# Patient Record
Sex: Female | Born: 1960
Health system: Southern US, Community
[De-identification: ages and names within clinical notes are randomized; demographics above are authoritative.]

## PROBLEM LIST (undated history)

## (undated) DIAGNOSIS — R7303 Prediabetes: Secondary | ICD-10-CM

## (undated) DIAGNOSIS — E663 Overweight: Secondary | ICD-10-CM

## (undated) DIAGNOSIS — F32A Depression, unspecified: Secondary | ICD-10-CM

## (undated) HISTORY — DX: Prediabetes: R73.03

## (undated) HISTORY — DX: Depression, unspecified: F32.A

## (undated) HISTORY — DX: Overweight: E66.3

## (undated) HISTORY — PX: APPENDECTOMY: SHX54

---

## 2012-07-31 ENCOUNTER — Ambulatory Visit (INDEPENDENT_AMBULATORY_CARE_PROVIDER_SITE_OTHER): Payer: BC Managed Care – PPO | Admitting: Psychology

## 2012-07-31 DIAGNOSIS — F4323 Adjustment disorder with mixed anxiety and depressed mood: Secondary | ICD-10-CM

## 2012-08-30 ENCOUNTER — Ambulatory Visit (INDEPENDENT_AMBULATORY_CARE_PROVIDER_SITE_OTHER): Payer: BC Managed Care – PPO | Admitting: Psychology

## 2012-08-30 DIAGNOSIS — F4323 Adjustment disorder with mixed anxiety and depressed mood: Secondary | ICD-10-CM

## 2012-09-11 ENCOUNTER — Ambulatory Visit (INDEPENDENT_AMBULATORY_CARE_PROVIDER_SITE_OTHER): Payer: BC Managed Care – PPO | Admitting: Psychology

## 2012-09-11 DIAGNOSIS — F4323 Adjustment disorder with mixed anxiety and depressed mood: Secondary | ICD-10-CM

## 2012-12-11 ENCOUNTER — Ambulatory Visit (INDEPENDENT_AMBULATORY_CARE_PROVIDER_SITE_OTHER): Payer: BC Managed Care – PPO | Admitting: Psychology

## 2012-12-11 DIAGNOSIS — F4323 Adjustment disorder with mixed anxiety and depressed mood: Secondary | ICD-10-CM

## 2012-12-18 ENCOUNTER — Ambulatory Visit (INDEPENDENT_AMBULATORY_CARE_PROVIDER_SITE_OTHER): Payer: BC Managed Care – PPO | Admitting: Psychology

## 2012-12-18 DIAGNOSIS — F4323 Adjustment disorder with mixed anxiety and depressed mood: Secondary | ICD-10-CM

## 2013-01-08 ENCOUNTER — Ambulatory Visit: Payer: BC Managed Care – PPO | Admitting: Psychology

## 2013-01-15 ENCOUNTER — Ambulatory Visit (INDEPENDENT_AMBULATORY_CARE_PROVIDER_SITE_OTHER): Payer: BC Managed Care – PPO | Admitting: Psychology

## 2013-01-15 DIAGNOSIS — F4323 Adjustment disorder with mixed anxiety and depressed mood: Secondary | ICD-10-CM

## 2013-01-29 ENCOUNTER — Ambulatory Visit (INDEPENDENT_AMBULATORY_CARE_PROVIDER_SITE_OTHER): Payer: BC Managed Care – PPO | Admitting: Psychology

## 2013-01-29 DIAGNOSIS — F4323 Adjustment disorder with mixed anxiety and depressed mood: Secondary | ICD-10-CM

## 2013-02-05 ENCOUNTER — Ambulatory Visit (INDEPENDENT_AMBULATORY_CARE_PROVIDER_SITE_OTHER): Payer: BC Managed Care – PPO | Admitting: Psychology

## 2013-02-05 DIAGNOSIS — F4323 Adjustment disorder with mixed anxiety and depressed mood: Secondary | ICD-10-CM

## 2013-02-19 ENCOUNTER — Ambulatory Visit (INDEPENDENT_AMBULATORY_CARE_PROVIDER_SITE_OTHER): Payer: BC Managed Care – PPO | Admitting: Psychology

## 2013-02-19 DIAGNOSIS — F4323 Adjustment disorder with mixed anxiety and depressed mood: Secondary | ICD-10-CM

## 2013-03-05 ENCOUNTER — Ambulatory Visit: Payer: BC Managed Care – PPO | Admitting: Psychology

## 2013-03-26 ENCOUNTER — Ambulatory Visit: Payer: BC Managed Care – PPO | Admitting: Psychology

## 2013-04-02 ENCOUNTER — Ambulatory Visit: Payer: BC Managed Care – PPO | Admitting: Psychology

## 2013-04-09 ENCOUNTER — Ambulatory Visit: Payer: BC Managed Care – PPO | Admitting: Psychology

## 2013-10-25 ENCOUNTER — Emergency Department (HOSPITAL_COMMUNITY)
Admission: EM | Admit: 2013-10-25 | Discharge: 2013-10-25 | Disposition: A | Discharge status: Home / Self Care | Payer: BC Managed Care – PPO | Attending: Emergency Medicine | Admitting: Emergency Medicine

## 2013-10-25 ENCOUNTER — Encounter (HOSPITAL_COMMUNITY): Payer: Self-pay | Admitting: Emergency Medicine

## 2013-10-25 DIAGNOSIS — Y9289 Other specified places as the place of occurrence of the external cause: Secondary | ICD-10-CM | POA: Diagnosis not present

## 2013-10-25 DIAGNOSIS — W208XXA Other cause of strike by thrown, projected or falling object, initial encounter: Secondary | ICD-10-CM | POA: Diagnosis not present

## 2013-10-25 DIAGNOSIS — S91119A Laceration without foreign body of unspecified toe without damage to nail, initial encounter: Secondary | ICD-10-CM

## 2013-10-25 DIAGNOSIS — Y9389 Activity, other specified: Secondary | ICD-10-CM | POA: Diagnosis not present

## 2013-10-25 DIAGNOSIS — Z23 Encounter for immunization: Secondary | ICD-10-CM | POA: Diagnosis not present

## 2013-10-25 DIAGNOSIS — S91109A Unspecified open wound of unspecified toe(s) without damage to nail, initial encounter: Secondary | ICD-10-CM | POA: Diagnosis not present

## 2013-10-25 MED ORDER — HYDROCODONE-ACETAMINOPHEN 5-325 MG PO TABS: 1.0000 | ORAL_TABLET | ORAL | Status: DC | PRN

## 2013-10-25 MED ORDER — LIDOCAINE HCL 2 % IJ SOLN
5.0000 mL | Freq: Once | INTRAMUSCULAR | Status: AC
  Administered 2013-10-25: 2 mL via INTRADERMAL
  Filled 2013-10-25: qty 20

## 2013-10-25 MED ORDER — TETANUS-DIPHTH-ACELL PERTUSSIS 5-2.5-18.5 LF-MCG/0.5 IM SUSP
0.5000 mL | Freq: Once | INTRAMUSCULAR | Status: AC
  Administered 2013-10-25: 0.5 mL via INTRAMUSCULAR
  Filled 2013-10-25: qty 0.5

## 2013-10-25 NOTE — ED Notes (Addendum)
MD at bedside. EDP WENTZ TO SUTURE

## 2013-10-25 NOTE — ED Notes (Signed)
Bed: WA08 Expected date:  Expected time:  Means of arrival:  Comments: EMS/53 yo female-lac great toe
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
## 2013-10-25 NOTE — Discharge Instructions (Signed)
Have the sutures removed in 8-10 days. Return here if needed for problems.   Laceration Care, Adult A laceration is a cut or lesion that goes through all layers of the skin and into the tissue just beneath the skin. TREATMENT  Some lacerations may not require closure. Some lacerations may not be able to be closed due to an increased risk of infection. It is important to see your caregiver as soon as possible after an injury to minimize the risk of infection and maximize the opportunity for successful closure. If closure is appropriate, pain medicines may be given, if needed. The wound will be cleaned to help prevent infection. Your caregiver will use stitches (sutures), staples, wound glue (adhesive), or skin adhesive strips to repair the laceration. These tools bring the skin edges together to allow for faster healing and a better cosmetic outcome. However, all wounds will heal with a scar. Once the wound has healed, scarring can be minimized by covering the wound with sunscreen during the day for 1 full year. HOME CARE INSTRUCTIONS  For sutures or staples:  Keep the wound clean and dry.  If you were given a bandage (dressing), you should change it at least once a day. Also, change the dressing if it becomes wet or dirty, or as directed by your caregiver.  Wash the wound with soap and water 2 times a day. Rinse the wound off with water to remove all soap. Pat the wound dry with a clean towel.  After cleaning, apply a thin layer of the antibiotic ointment as recommended by your caregiver. This will help prevent infection and keep the dressing from sticking.  You may shower as usual after the first 24 hours. Do not soak the wound in water until the sutures are removed.  Only take over-the-counter or prescription medicines for pain, discomfort, or fever as directed by your caregiver.  Get your sutures or staples removed as directed by your caregiver. For skin adhesive strips:  Keep the wound  clean and dry.  Do not get the skin adhesive strips wet. You may bathe carefully, using caution to keep the wound dry.  If the wound gets wet, pat it dry with a clean towel.  Skin adhesive strips will fall off on their own. You may trim the strips as the wound heals. Do not remove skin adhesive strips that are still stuck to the wound. They will fall off in time. For wound adhesive:  You may briefly wet your wound in the shower or bath. Do not soak or scrub the wound. Do not swim. Avoid periods of heavy perspiration until the skin adhesive has fallen off on its own. After showering or bathing, gently pat the wound dry with a clean towel.  Do not apply liquid medicine, cream medicine, or ointment medicine to your wound while the skin adhesive is in place. This may loosen the film before your wound is healed.  If a dressing is placed over the wound, be careful not to apply tape directly over the skin adhesive. This may cause the adhesive to be pulled off before the wound is healed.  Avoid prolonged exposure to sunlight or tanning lamps while the skin adhesive is in place. Exposure to ultraviolet light in the first year will darken the scar.  The skin adhesive will usually remain in place for 5 to 10 days, then naturally fall off the skin. Do not pick at the adhesive film. You may need a tetanus shot if:  You cannot  remember when you had your last tetanus shot.  You have never had a tetanus shot. If you get a tetanus shot, your arm may swell, get red, and feel warm to the touch. This is common and not a problem. If you need a tetanus shot and you choose not to have one, there is a rare chance of getting tetanus. Sickness from tetanus can be serious. SEEK MEDICAL CARE IF:   You have redness, swelling, or increasing pain in the wound.  You see a red line that goes away from the wound.  You have yellowish-white fluid (pus) coming from the wound.  You have a fever.  You notice a bad smell  coming from the wound or dressing.  Your wound breaks open before or after sutures have been removed.  You notice something coming out of the wound such as wood or glass.  Your wound is on your hand or foot and you cannot move a finger or toe. SEEK IMMEDIATE MEDICAL CARE IF:   Your pain is not controlled with prescribed medicine.  You have severe swelling around the wound causing pain and numbness or a change in color in your arm, hand, leg, or foot.  Your wound splits open and starts bleeding.  You have worsening numbness, weakness, or loss of function of any joint around or beyond the wound.  You develop painful lumps near the wound or on the skin anywhere on your body. MAKE SURE YOU:   Understand these instructions.  Will watch your condition.  Will get help right away if you are not doing well or get worse. Document Released: 03/05/2005 Document Revised: 05/28/2011 Document Reviewed: 08/29/2010 [facility] Patient Information 2015 [place], [place]. This information is not intended to replace advice given to you by your health care provider. Make sure you discuss any questions you have with your health care provider.  Stitches, Staples, or Skin Adhesive Strips  Stitches (sutures), staples, and skin adhesive strips hold the skin together as it heals. They will usually be in place for 7 days or less. HOME CARE  Wash your hands with soap and water before and after you touch your wound.  Only take medicine as told by your doctor.  Cover your wound only if your doctor told you to. Otherwise, leave it open to air.  Do not get your stitches wet or dirty. If they get dirty, dab them gently with a clean washcloth. Wet the washcloth with soapy water. Do not rub. Pat them dry gently.  Do not put medicine or medicated cream on your stitches unless your doctor told you to.  Do not take out your own stitches or staples. Skin adhesive strips will fall off by themselves.  Do not pick at  the wound. Picking can cause an infection.  Do not miss your follow-up appointment.  If you have problems or questions, call your doctor. GET HELP RIGHT AWAY IF:   You have a temperature by mouth above 102 F (38.9 C), not controlled by medicine.  You have chills.  You have redness or pain around your stitches.  There is puffiness (swelling) around your stitches.  You notice fluid (drainage) from your stitches.  There is a bad smell coming from your wound. MAKE SURE YOU:  Understand these instructions.  Will watch your condition.  Will get help if you are not doing well or get worse. Document Released: 12/31/2008 Document Revised: 05/28/2011 Document Reviewed: 12/31/2008 [facility] Patient Information 2015 [place], [place]. This information is not intended  to replace advice given to you by your health care provider. Make sure you discuss any questions you have with your health care provider. ° °

## 2015-01-17 ENCOUNTER — Other Ambulatory Visit: Payer: Self-pay

## 2015-01-17 DIAGNOSIS — Z1231 Encounter for screening mammogram for malignant neoplasm of breast: Secondary | ICD-10-CM

## 2015-02-17 ENCOUNTER — Ambulatory Visit
Admission: RE | Admit: 2015-02-17 | Discharge: 2015-02-17 | Disposition: A | Discharge status: Home / Self Care | Payer: 59 | Source: Ambulatory Visit

## 2015-02-17 DIAGNOSIS — Z1231 Encounter for screening mammogram for malignant neoplasm of breast: Secondary | ICD-10-CM

## 2015-03-24 ENCOUNTER — Ambulatory Visit: Payer: Self-pay | Admitting: Psychology

## 2015-04-04 ENCOUNTER — Ambulatory Visit (INDEPENDENT_AMBULATORY_CARE_PROVIDER_SITE_OTHER): Payer: 59 | Admitting: Psychology

## 2015-04-04 DIAGNOSIS — F4323 Adjustment disorder with mixed anxiety and depressed mood: Secondary | ICD-10-CM

## 2019-12-29 ENCOUNTER — Ambulatory Visit (INDEPENDENT_AMBULATORY_CARE_PROVIDER_SITE_OTHER): Payer: 59 | Admitting: Psychology

## 2019-12-29 DIAGNOSIS — F4323 Adjustment disorder with mixed anxiety and depressed mood: Secondary | ICD-10-CM | POA: Diagnosis not present

## 2020-01-12 ENCOUNTER — Ambulatory Visit (INDEPENDENT_AMBULATORY_CARE_PROVIDER_SITE_OTHER): Payer: 59 | Admitting: Psychology

## 2020-01-12 DIAGNOSIS — F4323 Adjustment disorder with mixed anxiety and depressed mood: Secondary | ICD-10-CM

## 2020-01-18 ENCOUNTER — Encounter (INDEPENDENT_AMBULATORY_CARE_PROVIDER_SITE_OTHER): Payer: Self-pay | Admitting: Family Medicine

## 2020-01-18 ENCOUNTER — Other Ambulatory Visit: Payer: Self-pay

## 2020-01-18 ENCOUNTER — Ambulatory Visit (INDEPENDENT_AMBULATORY_CARE_PROVIDER_SITE_OTHER): Payer: 59 | Admitting: Family Medicine

## 2020-01-18 VITALS — BP 145/79 | HR 71 | Temp 97.9°F | Ht 67.0 in | Wt 260.0 lb

## 2020-01-18 DIAGNOSIS — R5383 Other fatigue: Secondary | ICD-10-CM

## 2020-01-18 DIAGNOSIS — F32A Depression, unspecified: Secondary | ICD-10-CM | POA: Diagnosis not present

## 2020-01-18 DIAGNOSIS — E7849 Other hyperlipidemia: Secondary | ICD-10-CM

## 2020-01-18 DIAGNOSIS — F419 Anxiety disorder, unspecified: Secondary | ICD-10-CM

## 2020-01-18 DIAGNOSIS — Z0289 Encounter for other administrative examinations: Secondary | ICD-10-CM

## 2020-01-18 DIAGNOSIS — Z9189 Other specified personal risk factors, not elsewhere classified: Secondary | ICD-10-CM | POA: Diagnosis not present

## 2020-01-18 DIAGNOSIS — R7303 Prediabetes: Secondary | ICD-10-CM

## 2020-01-18 DIAGNOSIS — Z6841 Body Mass Index (BMI) 40.0 and over, adult: Secondary | ICD-10-CM

## 2020-01-18 DIAGNOSIS — R030 Elevated blood-pressure reading, without diagnosis of hypertension: Secondary | ICD-10-CM

## 2020-01-18 DIAGNOSIS — R0602 Shortness of breath: Secondary | ICD-10-CM | POA: Diagnosis not present

## 2020-01-18 MED ORDER — ESCITALOPRAM OXALATE 10 MG PO TABS: 10.0000 mg | ORAL_TABLET | Freq: Every day | ORAL | 0 refills | Status: DC

## 2020-01-19 LAB — CBC WITH DIFFERENTIAL/PLATELET
Basophils Absolute: 0 10*3/uL (ref 0.0–0.2)
Basos: 1 %
EOS (ABSOLUTE): 0.2 10*3/uL (ref 0.0–0.4)
Eos: 3 %
Hematocrit: 44.9 % (ref 34.0–46.6)
Hemoglobin: 14.9 g/dL (ref 11.1–15.9)
Immature Grans (Abs): 0 10*3/uL (ref 0.0–0.1)
Immature Granulocytes: 0 %
Lymphocytes Absolute: 2 10*3/uL (ref 0.7–3.1)
Lymphs: 34 %
MCH: 28.9 pg (ref 26.6–33.0)
MCHC: 33.2 g/dL (ref 31.5–35.7)
MCV: 87 fL (ref 79–97)
Monocytes Absolute: 0.5 10*3/uL (ref 0.1–0.9)
Monocytes: 9 %
Neutrophils Absolute: 3.1 10*3/uL (ref 1.4–7.0)
Neutrophils: 53 %
Platelets: 299 10*3/uL (ref 150–450)
RBC: 5.15 x10E6/uL (ref 3.77–5.28)
RDW: 12.8 % (ref 11.7–15.4)
WBC: 5.8 10*3/uL (ref 3.4–10.8)

## 2020-01-19 LAB — COMPREHENSIVE METABOLIC PANEL
ALT: 22 IU/L (ref 0–32)
AST: 15 IU/L (ref 0–40)
Albumin/Globulin Ratio: 1.4 (ref 1.2–2.2)
Albumin: 4.4 g/dL (ref 3.8–4.9)
Alkaline Phosphatase: 159 IU/L — ABNORMAL HIGH (ref 44–121)
BUN/Creatinine Ratio: 11 (ref 9–23)
BUN: 10 mg/dL (ref 6–24)
Bilirubin Total: 0.2 mg/dL (ref 0.0–1.2)
CO2: 25 mmol/L (ref 20–29)
Calcium: 9.7 mg/dL (ref 8.7–10.2)
Chloride: 101 mmol/L (ref 96–106)
Creatinine, Ser: 0.91 mg/dL (ref 0.57–1.00)
GFR calc Af Amer: 81 mL/min/{1.73_m2} (ref >59)
GFR calc non Af Amer: 70 mL/min/{1.73_m2} (ref >59)
Globulin, Total: 3.2 g/dL (ref 1.5–4.5)
Glucose: 112 mg/dL — ABNORMAL HIGH (ref 65–99)
Potassium: 5 mmol/L (ref 3.5–5.2)
Sodium: 138 mmol/L (ref 134–144)
Total Protein: 7.6 g/dL (ref 6.0–8.5)

## 2020-01-19 LAB — HEMOGLOBIN A1C
Est. average glucose Bld gHb Est-mCnc: 128 mg/dL
Hgb A1c MFr Bld: 6.1 % — ABNORMAL HIGH (ref 4.8–5.6)

## 2020-01-19 LAB — LIPID PANEL WITH LDL/HDL RATIO
Cholesterol, Total: 268 mg/dL — ABNORMAL HIGH (ref 100–199)
HDL: 72 mg/dL (ref >39)
LDL Chol Calc (NIH): 177 mg/dL — ABNORMAL HIGH (ref 0–99)
LDL/HDL Ratio: 2.5 ratio (ref 0.0–3.2)
Triglycerides: 112 mg/dL (ref 0–149)
VLDL Cholesterol Cal: 19 mg/dL (ref 5–40)

## 2020-01-19 LAB — VITAMIN D 25 HYDROXY (VIT D DEFICIENCY, FRACTURES): Vit D, 25-Hydroxy: 25.8 ng/mL — ABNORMAL LOW (ref 30.0–100.0)

## 2020-01-19 LAB — T3: T3, Total: 134 ng/dL (ref 71–180)

## 2020-01-19 LAB — FOLATE: Folate: 10.4 ng/mL (ref >3.0)

## 2020-01-19 LAB — VITAMIN B12: Vitamin B-12: 432 pg/mL (ref 232–1245)

## 2020-01-19 LAB — T4: T4, Total: 7.5 ug/dL (ref 4.5–12.0)

## 2020-01-19 LAB — TSH: TSH: 1.68 u[IU]/mL (ref 0.450–4.500)

## 2020-01-19 LAB — INSULIN, RANDOM: INSULIN: 22.1 u[IU]/mL (ref 2.6–24.9)

## 2020-01-21 NOTE — Progress Notes (Signed)
Chief Complaint:   OBESITY Denise Grimes (MR# [id number]) is a 59 y.o. female who presents for evaluation and treatment of obesity and related comorbidities. Current BMI is Body mass index is 40.72 kg/m. Denise Grimes has been struggling with her weight for many years and has been unsuccessful in either losing weight, maintaining weight loss, or reaching her healthy weight goal.  Denise Grimes is currently in the action stage of change and ready to dedicate time achieving and maintaining a healthier weight. Denise Grimes is interested in becoming our patient and working on intensive lifestyle modifications including (but not limited to) diet and exercise for weight loss.  Denise Grimes's habits were reviewed today and are as follows: her desired weight loss is 75lbs, she started gaining weight at age [age], her heaviest weight ever was 260 pounds, she has significant food cravings issues, she is frequently drinking liquids with calories, she frequently makes poor food choices, she frequently eats larger portions than normal and she struggles with emotional eating.  Depression Screen Denise Grimes's Food and Mood (modified PHQ-9) score was 7.  Depression screen PHQ 2/9 01/18/2020  Decreased Interest 1  Down, Depressed, Hopeless 1  PHQ - 2 Score 2  Altered sleeping 0  Tired, decreased energy 2  Change in appetite 3  Feeling bad or failure about yourself  0  Trouble concentrating 0  Moving slowly or fidgety/restless 0  Suicidal thoughts 0  PHQ-9 Score 7  Difficult doing work/chores Not difficult at all   Subjective:   1. Other fatigue Denise Grimes denies daytime somnolence and denies waking up still tired. Patent has a history of symptoms of hypertension. Denise Grimes generally gets 8 or 9 hours of sleep per night, and states that she has generally restful sleep. Denise Grimes is present. [name] episodes are not present. Epworth Sleepiness Score is 0. EKG-normal sinus rhythm at 72 BPM with T wave inversion I and II, no prior EKG to compare.  2.  SOB (shortness of breath) on exertion Winfred notes increasing shortness of breath with exercising and seems to be worsening over time with weight gain. She notes getting out of breath sooner with activity than she used to. This has not gotten worse recently. Denise Grimes denies shortness of breath at rest or orthopnea.  3. Pre-diabetes Denise Grimes's A1c was 5.8 on 12/22/2018. She is not on medications, and was diagnosed in 2020.  4. Other hyperlipidemia Denise Grimes's last labs showed LDL 154, HDL 72, and triglycerides 373. She is not on statin, and elevations started last year.  5. Elevated BP without diagnosis of hypertension Denise Grimes denies a history of hypertension. Her blood pressure is slightly elevated today.  6. Anxiety and depression Denise Grimes's 28 year son has testicular cancer. She notes significant life stress with job. She denies suicidal or homicidal ideas.  7. At risk for diabetes mellitus Denise Grimes is at higher than average risk for developing diabetes due to her obesity.   Assessment/Plan:   1. Other fatigue Denise Grimes does feel that her weight is causing her energy to be lower than it should be. Fatigue may be related to obesity, depression or many other causes. Labs will be ordered, and in the meanwhile, [name] will focus on self care including making healthy food choices, increasing physical activity and focusing on stress reduction.  - EKG 12-Lead - Vitamin B12 - Folate - T3 - T4 - TSH - VITAMIN D 25 Hydroxy (Vit-D Deficiency, Fractures)  2. SOB (shortness of breath) on exertion Denise Grimes does feel that she gets out of breath more  easily that she used to when she exercises. Denise Grimes's shortness of breath appears to be obesity related and exercise induced. She has agreed to work on weight loss and gradually increase exercise to treat her exercise induced shortness of breath. Will continue to monitor closely.  - CBC with Differential/Platelet  3. Pre-diabetes Buelah will continue to work on weight loss, exercise, and  decreasing simple carbohydrates to help decrease the risk of diabetes. We will check labs today.  - Hemoglobin A1c - Insulin, random  4. Other hyperlipidemia Cardiovascular risk and specific lipid/LDL goals reviewed.  We discussed several lifestyle modifications today and Denise Grimes will continue to work on diet, exercise and weight loss efforts. We will check labs today. Orders and follow up as documented in patient record.   Counseling Intensive lifestyle modifications are the first line treatment for this issue. . Dietary changes: Increase soluble fiber. Decrease simple carbohydrates. . Exercise changes: Moderate to vigorous-intensity aerobic activity 150 minutes per week if tolerated. . Lipid-lowering medications: see documented in medical record.  - Lipid Panel With LDL/HDL Ratio  5. Elevated BP without diagnosis of hypertension Denise Grimes is working on healthy weight loss and exercise to improve blood pressure control. We will watch for signs of hypotension as she continues her lifestyle modifications. We will check labs today.  - Comprehensive metabolic panel  6. Anxiety and depression Behavior modification techniques were discussed today to help Denise Grimes deal with her anxiety. Denise Grimes agreed to start Lexapro 10 mg PO daily with no refills. Orders and follow up as documented in patient record.   - escitalopram (LEXAPRO) 10 MG tablet; Take 1 tablet (10 mg total) by mouth daily.  Dispense: 30 tablet; Refill: 0  7. At risk for diabetes mellitus Denise Grimes was given approximately 15 minutes of diabetes education and counseling today. We discussed intensive lifestyle modifications today with an emphasis on weight loss as well as increasing exercise and decreasing simple carbohydrates in her diet. We also reviewed medication options with an emphasis on risk versus benefit of those discussed.   Repetitive spaced learning was employed today to elicit superior memory formation and behavioral change.  8. Class 3  severe obesity with serious comorbidity and body mass index (BMI) of 40.0 to 44.9 in adult, unspecified obesity type (HCC) Denise Grimes is currently in the action stage of change and her goal is to continue with weight loss efforts. I recommend Donnell begin the structured treatment plan as follows:  She has agreed to the Category 3 Plan.  Exercise goals: No exercise has been prescribed at this time.   Behavioral modification strategies: increasing lean protein intake, meal planning and cooking strategies, keeping healthy foods in the home and planning for success.  She was informed of the importance of frequent follow-up visits to maximize her success with intensive lifestyle modifications for her multiple health conditions. She was informed we would discuss her lab results at her next visit unless there is a critical issue that needs to be addressed sooner. Jaleigha agreed to keep her next visit at the agreed upon time to discuss these results.  Objective:   Blood pressure (!) 145/79, pulse 71, temperature 97.9 F (36.6 C), temperature source Oral, height 5\' 7"  (1.702 m), weight 260 lb (117.9 kg), last menstrual period 10/18/2010, SpO2 95 %. Body mass index is 40.72 kg/m.  EKG: Normal sinus rhythm, rate 72 BPM.  Indirect Calorimeter completed today shows a VO2 of 277 and a REE of 1929.  Her calculated basal metabolic rate is 12/18/2010 thus  her basal metabolic rate is unchanged than expected.  General: Cooperative, alert, well developed, in no acute distress. HEENT: Conjunctivae and lids unremarkable. Cardiovascular: Regular rhythm. 2/6 systolic murmur crescendo, no radiating heard best at pulmonic area. Lungs: Normal work of breathing. Neurologic: No focal deficits.   Lab Results  Component Value Date   CREATININE 0.91 01/18/2020   BUN 10 01/18/2020   NA 138 01/18/2020   K 5.0 01/18/2020   CL 101 01/18/2020   CO2 25 01/18/2020   Lab Results  Component Value Date   ALT 22 01/18/2020   AST 15  01/18/2020   ALKPHOS 159 (H) 01/18/2020   BILITOT 0.2 01/18/2020   Lab Results  Component Value Date   HGBA1C 6.1 (H) 01/18/2020   Lab Results  Component Value Date   INSULIN 22.1 01/18/2020   Lab Results  Component Value Date   TSH 1.680 01/18/2020   Lab Results  Component Value Date   CHOL 268 (H) 01/18/2020   HDL 72 01/18/2020   LDLCALC 177 (H) 01/18/2020   TRIG 112 01/18/2020   Lab Results  Component Value Date   WBC 5.8 01/18/2020   HGB 14.9 01/18/2020   HCT 44.9 01/18/2020   MCV 87 01/18/2020   PLT 299 01/18/2020   No results found for: IRON, TIBC, FERRITIN  Attestation Statements:   Reviewed by clinician on day of visit: allergies, medications, problem list, medical history, surgical history, family history, social history, and previous encounter notes.   I, Burt Knack, am acting as transcriptionist for Reuben Likes, MD. This is the patient's first visit at [facility]. The patient's NEW PATIENT PACKET was reviewed at length. Included in the packet: current and past health history, medications, allergies, ROS, gynecologic history (women only), surgical history, family history, social history, weight history, weight loss surgery history (for those that have had weight loss surgery), nutritional evaluation, mood and food questionnaire, PHQ9, Epworth questionnaire, sleep habits questionnaire, patient life and health improvement goals questionnaire. These will all be scanned into the patient's chart under media.   During the visit, I independently reviewed the patient's EKG, bioimpedance scale results, and indirect calorimeter results. I used this information to tailor a meal plan for the patient that will help her to lose weight and will improve her obesity-related conditions going forward. I performed a medically necessary appropriate examination and/or evaluation. I discussed the assessment and treatment plan with the patient. The patient was  provided an opportunity to ask questions and all were answered. The patient agreed with the plan and demonstrated an understanding of the instructions. Labs were ordered at this visit and will be reviewed at the next visit unless more critical results need to be addressed immediately. Clinical information was updated and documented in the EMR.   Time spent on visit including pre-visit chart review and post-visit care was 45 minutes.   A separate 15 minutes was spent on risk counseling (see above).  I have reviewed the above documentation for accuracy and completeness, and I agree with the above. - Katherina Mires, MD

## 2020-01-26 ENCOUNTER — Ambulatory Visit (INDEPENDENT_AMBULATORY_CARE_PROVIDER_SITE_OTHER): Payer: 59 | Admitting: Psychology

## 2020-01-26 DIAGNOSIS — F4323 Adjustment disorder with mixed anxiety and depressed mood: Secondary | ICD-10-CM

## 2020-02-01 ENCOUNTER — Other Ambulatory Visit: Payer: Self-pay

## 2020-02-01 ENCOUNTER — Ambulatory Visit (INDEPENDENT_AMBULATORY_CARE_PROVIDER_SITE_OTHER): Payer: 59 | Admitting: Family Medicine

## 2020-02-01 ENCOUNTER — Encounter (INDEPENDENT_AMBULATORY_CARE_PROVIDER_SITE_OTHER): Payer: Self-pay | Admitting: Family Medicine

## 2020-02-01 VITALS — BP 128/79 | HR 58 | Temp 98.2°F | Ht 67.0 in | Wt 256.0 lb

## 2020-02-01 DIAGNOSIS — R7303 Prediabetes: Secondary | ICD-10-CM | POA: Insufficient documentation

## 2020-02-01 DIAGNOSIS — Z6841 Body Mass Index (BMI) 40.0 and over, adult: Secondary | ICD-10-CM

## 2020-02-01 DIAGNOSIS — Z8249 Family history of ischemic heart disease and other diseases of the circulatory system: Secondary | ICD-10-CM | POA: Insufficient documentation

## 2020-02-01 DIAGNOSIS — F39 Unspecified mood [affective] disorder: Secondary | ICD-10-CM | POA: Diagnosis not present

## 2020-02-01 DIAGNOSIS — E7849 Other hyperlipidemia: Secondary | ICD-10-CM

## 2020-02-01 DIAGNOSIS — Z9189 Other specified personal risk factors, not elsewhere classified: Secondary | ICD-10-CM

## 2020-02-01 DIAGNOSIS — E559 Vitamin D deficiency, unspecified: Secondary | ICD-10-CM | POA: Diagnosis not present

## 2020-02-01 DIAGNOSIS — E7841 Elevated Lipoprotein(a): Secondary | ICD-10-CM | POA: Insufficient documentation

## 2020-02-01 MED ORDER — ATORVASTATIN CALCIUM 10 MG PO TABS: 10.0000 mg | ORAL_TABLET | Freq: Every day | ORAL | 0 refills | Status: DC

## 2020-02-01 MED ORDER — ESCITALOPRAM OXALATE 10 MG PO TABS: 10.0000 mg | ORAL_TABLET | Freq: Every day | ORAL | 0 refills | Status: DC

## 2020-02-01 MED ORDER — VITAMIN D (ERGOCALCIFEROL) 1.25 MG (50000 UNIT) PO CAPS: 50000.0000 [IU] | ORAL_CAPSULE | ORAL | 0 refills | Status: DC

## 2020-02-01 NOTE — Patient Instructions (Addendum)
  The 10-year ASCVD risk score Denman George DC Montez Hageman., et al., 2013) is: 2.5%   Values used to calculate the score:     Age: 59 years     Sex: Female     Is Non-Hispanic African American: No     Diabetic: No     Tobacco smoker: No     Systolic Blood Pressure: 128 mmHg     Is BP treated: No     HDL Cholesterol: 72 mg/dL     Total Cholesterol: 268 mg/dL

## 2020-02-04 NOTE — Progress Notes (Signed)
Chief Complaint:   OBESITY Denise Grimes is here to discuss her progress with her obesity treatment plan along with follow-up of her obesity related diagnoses. Denise Grimes is on the Category 3 Plan and states she is following her eating plan approximately 50% of the time. Denise Grimes states she is walking 5,000-10,000 steps daily 5 times per week.  Today's visit was #: 2 Starting weight: 260 lbs Starting date: 01/18/2020 Today's weight: 256 lbs Today's date: 02/01/2020 Total lbs lost to date: 4 lbs Total lbs lost since last in-office visit: 4 lbs Total weight loss percentage to date: -1.54%  Interim History:   Denise Grimes is here today to review her NEW Meal Plan and to discuss all recent labs done here and/ or done at outside facilities.  Extended time was spent counseling Denise Grimes on all new disease processes that were discovered or that are worsening.  - Denise Grimes began the meal plan 2 weeks ago.  - She is eating it all and not skipping meals.  - She is tolerating it well and has no concerns or intolerances.  - Hunger and cravings are controlled.    Assessment/Plan:   1. Pre-diabetes Discussed labs with patient today.   Denise Grimes was diagnosed with pre-diabetes 1 year ago but has never been on medication. She does not recall her last A1c. She denies symptoms, except previous sweet cravings and polyphagia.  Plan:  Prudent nutritional plan with weight loss, decrease simple carbohydrates, and recheck labs in 3-4 months.    2. Other hyperlipidemia Worsening:  Discussed labs with patient today.  Denise Grimes's dad had a fatal AMI at age 61. He had his first AMI and a triple bypass in his early 45s. Denise Grimes drinks 1 gallon of water a day as well.   Plan:  Due to her family history and current LDL level, we will begin Lipitor 10 mg to be taken at bedtime after long d/c pt re: R/B meds.  -Prudent nutritional plan and increase exercise, eventually to the goal of 150+ minutes per week.  START- atorvastatin  (LIPITOR) 10 MG tablet; Take 1 tablet (10 mg total) by mouth at bedtime.  Dispense: 30 tablet; Refill: 0    3. Family history of early CAD Discussed labs with patient today. The 10-year ASCVD risk score Denman George DC Montez Hageman., et al., 2013) is: 2.5%   Plan: - Even though Denise Grimes's ASCVD is at 2.5%, due to her family history of having a 1st degree relative with early CAD, I recommend medication. - After a long discussion with Denise Grimes about the risk and benefits of statin medication, we started Lipitor today.   START- atorvastatin (LIPITOR) 10 MG tablet; Take 1 tablet (10 mg total) by mouth at bedtime.  Dispense: 30 tablet; Refill: 0    4. Vitamin D deficiency New:  Discussed labs with patient today.  Denise Grimes's Vitamin D level was 25.8 on 01/18/2020.    Plan:  - Discussed importance of vitamin D to their health and well-being.   - possible symptoms of low Vitamin D can be low energy, depressed mood, muscle aches, joint aches, osteoporosis etc.  - low Vitamin D levels may be linked to an increased risk of cardiovascular events and even increased risk of cancers- such as colon and breast.   - I recommend pt take a weekly prescription vit D- see script below    - this may be a lifelong thing, and we will need to monitor levels regularly to keep within normal limits.   -  weight loss will likely improve availability of vitamin D, thus encouraged Denise Grimes to continue with meal plan and their weight loss efforts to further improve this condition   START- Vitamin D, Ergocalciferol, (DRISDOL) 1.25 MG (50000 UNIT) CAPS capsule; Take 1 capsule (50,000 Units total) by mouth every 7 (seven) days.  Dispense: 4 capsule; Refill: 0    5. Mood disorder (HCC) Discussed labs with patient today. Denise Grimes was prescribed Lexapro by Dr. Lawson Radar at her last office visit for generalized anxiety disorder, depressed mood, and increased home and work stressors. She has been on Lexapro in the past and it worked well. She is  tolerating it well and denies side effects at this time.     Plan:  - Mood is well controlled currently.  No SI - Counseled patient on pathophysiology of disease and discussed various treatment options, which often includes dietary and lifestyle modifications as first line, in addition to discussing the risks and benefits of various medications.  - discussed benefits of daily gratefulness journal - Use free meditation apps and do "stress meditation" or "meditation for anxiety or depression" for minimum of 10 minutes, twice daily. - For guided meditation/ breathing exercises, advised patient to look into the Headspace, Calm, Breathe, or Ten Percent apps. -Explained to patient we need to infuse their brain with more positive thoughts, getting them to become more hopeful and feel more in control. - In addition to possible prescription interventions, reviewed the "spokes of the wheel" of mood and health management.   - Stressed the importance of ongoing prudent health habits, including regular exercise, appropriate sleep hygiene, healthy dietary habits, prayer/ meditation to help with mood stability and seeking the help of a professional counselor discussed.   - Anticipatory guidance given and extensive counseling and education provided to patient today.  - All questions answered.  REFILL- escitalopram (LEXAPRO) 10 MG tablet; Take 1 tablet (10 mg total) by mouth daily.  Dispense: 30 tablet; Refill: 0    6. At risk for heart disease Due to Denise Grimes's current state of health and medical condition(s), she is at a higher risk for heart disease.   This puts the patient at much greater risk to subsequently develop cardiopulmonary conditions that can significantly affect patient's quality of life in a negative manner as well.    At least 28+ minutes was spent on counseling Denise Grimes about these concerns today and I stressed the importance of reversing risks factors of obesity, esp truncal and visceral fat,  hypertension, hyperlipidemia, pre-diabetes. Initial goal is to lose at least 5-10% of starting weight to help reduce these risk factors. Counseling: Intensive lifestyle modifications discussed with Denise Grimes as most appropriate first line treatment.  she will continue to work on diet, exercise and weight loss efforts.  We will continue to reassess these conditions on a fairly regular basis in an attempt to decrease patient's overall morbidity and mortality.  Evidence-based interventions for health behavior change were utilized today including the discussion of self monitoring techniques, problem-solving barriers and SMART goal setting techniques.  Specifically regarding patient's less desirable eating habits and patterns, we employed the technique of small changes when Denise Grimes has not been able to fully commit to her prudent nutritional plan.  7. Class 3 severe obesity with serious comorbidity and body mass index (BMI) of 40.0 to 44.9 in adult, unspecified obesity type (HCC) Denise Grimes is currently in the action stage of change. As such, her goal is to continue with weight loss efforts. She has agreed to the Category  3 Plan.   Exercise goals: As is (for now)  Behavioral modification strategies: increasing lean protein intake, decreasing simple carbohydrates, meal planning and cooking strategies, holiday eating strategies  and planning for success.  Denise Grimes has agreed to follow-up with our clinic in 2 weeks after starting Lipitor and Vit D this visit. She was informed of the importance of frequent follow-up visits to maximize her success with intensive lifestyle modifications for her multiple health conditions.    Objective:   Blood pressure 128/79, pulse (!) 58, temperature 98.2 F (36.8 C), height 5\' 7"  (1.702 m), weight 256 lb (116.1 kg), last menstrual period 10/18/2010, SpO2 97 %. Body mass index is 40.1 kg/m.  General: Cooperative, alert, well developed, in no acute distress. HEENT: Conjunctivae and lids  unremarkable. Cardiovascular: Regular rhythm.  Lungs: Normal work of breathing. Neurologic: No focal deficits.   Lab Results  Component Value Date   CREATININE 0.91 01/18/2020   BUN 10 01/18/2020   NA 138 01/18/2020   K 5.0 01/18/2020   CL 101 01/18/2020   CO2 25 01/18/2020   Lab Results  Component Value Date   ALT 22 01/18/2020   AST 15 01/18/2020   ALKPHOS 159 (H) 01/18/2020   BILITOT 0.2 01/18/2020   Lab Results  Component Value Date   HGBA1C 6.1 (H) 01/18/2020   Lab Results  Component Value Date   INSULIN 22.1 01/18/2020   Lab Results  Component Value Date   TSH 1.680 01/18/2020   Lab Results  Component Value Date   CHOL 268 (H) 01/18/2020   HDL 72 01/18/2020   LDLCALC 177 (H) 01/18/2020   TRIG 112 01/18/2020   Lab Results  Component Value Date   WBC 5.8 01/18/2020   HGB 14.9 01/18/2020   HCT 44.9 01/18/2020   MCV 87 01/18/2020   PLT 299 01/18/2020   No results found for: IRON, TIBC, FERRITIN  Attestation Statements:   Reviewed by clinician on day of visit: allergies, medications, problem list, medical history, surgical history, family history, social history, and previous encounter notes.  13/03/2019, am acting as Edmund Hilda for Energy manager, DO.  I have reviewed the above documentation for accuracy and completeness, and I agree with the above. -  *[employer], D.O.  The 21st Century Cures Act was signed into law in 2016 which includes the topic of electronic health records.  This provides immediate access to information in MyChart.  This includes consultation notes, operative notes, office notes, lab results and pathology reports.  If you have any questions about what you read please let 2017 know at your next visit so we can discuss your concerns and take corrective action if need be.  We are right here with you.

## 2020-02-09 ENCOUNTER — Ambulatory Visit: Payer: 59 | Admitting: Psychology

## 2020-02-15 ENCOUNTER — Encounter (INDEPENDENT_AMBULATORY_CARE_PROVIDER_SITE_OTHER): Payer: Self-pay | Admitting: Family Medicine

## 2020-02-15 ENCOUNTER — Other Ambulatory Visit: Payer: Self-pay

## 2020-02-15 ENCOUNTER — Ambulatory Visit (INDEPENDENT_AMBULATORY_CARE_PROVIDER_SITE_OTHER): Payer: 59 | Admitting: Family Medicine

## 2020-02-15 VITALS — BP 129/67 | HR 66 | Temp 98.2°F | Ht 67.0 in | Wt 253.0 lb

## 2020-02-15 DIAGNOSIS — E7849 Other hyperlipidemia: Secondary | ICD-10-CM

## 2020-02-15 DIAGNOSIS — Z9189 Other specified personal risk factors, not elsewhere classified: Secondary | ICD-10-CM | POA: Insufficient documentation

## 2020-02-15 DIAGNOSIS — E559 Vitamin D deficiency, unspecified: Secondary | ICD-10-CM

## 2020-02-15 DIAGNOSIS — F39 Unspecified mood [affective] disorder: Secondary | ICD-10-CM

## 2020-02-15 DIAGNOSIS — Z6839 Body mass index (BMI) 39.0-39.9, adult: Secondary | ICD-10-CM

## 2020-02-15 MED ORDER — ATORVASTATIN CALCIUM 10 MG PO TABS: 10.0000 mg | ORAL_TABLET | Freq: Every day | ORAL | 0 refills | Status: DC

## 2020-02-15 MED ORDER — ESCITALOPRAM OXALATE 10 MG PO TABS: 10.0000 mg | ORAL_TABLET | Freq: Every day | ORAL | 0 refills | Status: DC

## 2020-02-16 NOTE — Progress Notes (Signed)
Chief Complaint:   OBESITY Denise Grimes is here to discuss her progress with her obesity treatment plan along with follow-up of her obesity related diagnoses. Denise Grimes is on the Category 3 Plan and states she is following her eating plan approximately 90% of the time. Denise Grimes states she is walking 20 minutes 5 times per week.  Today's visit was #: 3 Starting weight: 260 lbs Starting date: 01/18/2020 Today's weight: 253 lbs Today's date: 02/15/2020 Total lbs lost to date: 7 lbs Total lbs lost since last in-office visit: 3 lbs Total weight loss percentage to date: -2.69%  Interim History: Keyatta states that she found herself checking a bit more with amounts of proteins and measuring vegetables.  Assessment/Plan:   1. Other hyperlipidemia At her last office visit, we started Denise Grimes on Lipitor. She is tolerating it well without issues. Denise Grimes has hyperlipidemia and has been trying to improve her cholesterol levels with intensive lifestyle modification including a low saturated fat diet, exercise and weight loss. She denies any chest pain, claudication or myalgias.  Lab Results  Component Value Date   ALT 22 01/18/2020   AST 15 01/18/2020   ALKPHOS 159 (H) 01/18/2020   BILITOT 0.2 01/18/2020   Lab Results  Component Value Date   CHOL 268 (H) 01/18/2020   HDL 72 01/18/2020   LDLCALC 177 (H) 01/18/2020   TRIG 112 01/18/2020   Plan: Refill Lipitor for 1 month, as per below.  Refill- atorvastatin (LIPITOR) 10 MG tablet; Take 1 tablet (10 mg total) by mouth at bedtime.  Dispense: 30 tablet; Refill: 0  2. Vitamin D deficiency We started Denise Grimes on Vit D at her last office visit. She is tolerating it well without issues. Her Vitamin D level was 25.8 on 01/18/2020. She is currently taking prescription vitamin D 50,000 IU each week. She denies nausea, vomiting or muscle weakness.  Plan: Continue prescription Vit D.   3. Mood disorder (HCC), with emotional eating Denise Grimes is taking Lexapro 10 mg and  tolerating it well. Denise Grimes is struggling with emotional eating and using food for comfort to the extent that it is negatively impacting her health. She has been working on behavior modification techniques to help reduce her emotional eating. She shows no sign of suicidal or homicidal ideations.   Plan: Refill Lexapro for 1 month, as per below.  - Patient was counseled on the risks and benefits of medications including possible side effects were reviewed.   - In addition to possible prescription interventions, reviewed the "spokes of the wheel" of mood and health management.   - Stressed the importance of ongoing prudent health habits, including regular exercise, appropriate sleep hygiene, healthy dietary habits, prayer/ meditation to help with mood stability and seeking the help of a professional counselor discussed.    - Use free meditation apps and do "stress meditation" or "meditation for anxiety or depression" for minimum of 10 minutes, twice daily. - For guided meditation/ breathing exercises, advised patient to look into the Headspace, Calm, Breathe, or Ten Percent apps.  - Anticipatory guidance given and extensive counseling and education provided to patient today.  All questions answered.  Refill- escitalopram (LEXAPRO) 10 MG tablet; Take 1 tablet (10 mg total) by mouth daily.  Dispense: 30 tablet; Refill: 0  4. At risk for impaired metabolic function Denise Grimes was given approximately 10 minutes of impaired  metabolic function prevention counseling today. We discussed intensive lifestyle modifications today with an emphasis on specific nutrition and exercise instructions and strategies.  5. Class 2 severe obesity with serious comorbidity and body mass index (BMI) of 39.0 to 39.9 in adult, unspecified obesity type (HCC) Denise Grimes is currently in the action stage of change. As such, her goal is to continue with weight loss efforts. She has agreed to the Category 3 Plan.   Exercise goals: As  is  Behavioral modification strategies: decreasing simple carbohydrates, no skipping meals, meal planning and cooking strategies and planning for success.  Denise Grimes has agreed to follow-up with our clinic in 2 weeks. She was informed of the importance of frequent follow-up visits to maximize her success with intensive lifestyle modifications for her multiple health conditions.   Objective:   Blood pressure 129/67, pulse 66, temperature 98.2 F (36.8 C), height 5\' 7"  (1.702 m), weight 253 lb (114.8 kg), last menstrual period 10/18/2010, SpO2 100 %. Body mass index is 39.63 kg/m.  General: Cooperative, alert, well developed, in no acute distress. HEENT: Conjunctivae and lids unremarkable. Cardiovascular: Regular rhythm.  Lungs: Normal work of breathing. Neurologic: No focal deficits.   Lab Results  Component Value Date   CREATININE 0.91 01/18/2020   BUN 10 01/18/2020   NA 138 01/18/2020   K 5.0 01/18/2020   CL 101 01/18/2020   CO2 25 01/18/2020   Lab Results  Component Value Date   ALT 22 01/18/2020   AST 15 01/18/2020   ALKPHOS 159 (H) 01/18/2020   BILITOT 0.2 01/18/2020   Lab Results  Component Value Date   HGBA1C 6.1 (H) 01/18/2020   Lab Results  Component Value Date   INSULIN 22.1 01/18/2020   Lab Results  Component Value Date   TSH 1.680 01/18/2020   Lab Results  Component Value Date   CHOL 268 (H) 01/18/2020   HDL 72 01/18/2020   LDLCALC 177 (H) 01/18/2020   TRIG 112 01/18/2020   Lab Results  Component Value Date   WBC 5.8 01/18/2020   HGB 14.9 01/18/2020   HCT 44.9 01/18/2020   MCV 87 01/18/2020   PLT 299 01/18/2020    Attestation Statements:   Reviewed by clinician on day of visit: allergies, medications, problem list, medical history, surgical history, family history, social history, and previous encounter notes.  13/03/2019, am acting as Edmund Hilda for Energy manager, DO.  I have reviewed the above documentation for accuracy and  completeness, and I agree with the above. [employer], D.O.  The 21st Century Cures Act was signed into law in 2016 which includes the topic of electronic health records.  This provides immediate access to information in MyChart.  This includes consultation notes, operative notes, office notes, lab results and pathology reports.  If you have any questions about what you read please let 2017 know at your next visit so we can discuss your concerns and take corrective action if need be.  We are right here with you.

## 2020-02-23 ENCOUNTER — Ambulatory Visit: Payer: 59 | Admitting: Psychology

## 2020-02-28 ENCOUNTER — Other Ambulatory Visit (INDEPENDENT_AMBULATORY_CARE_PROVIDER_SITE_OTHER): Payer: Self-pay | Admitting: Family Medicine

## 2020-02-28 DIAGNOSIS — E559 Vitamin D deficiency, unspecified: Secondary | ICD-10-CM

## 2020-03-01 ENCOUNTER — Other Ambulatory Visit: Payer: Self-pay

## 2020-03-01 ENCOUNTER — Ambulatory Visit (INDEPENDENT_AMBULATORY_CARE_PROVIDER_SITE_OTHER): Payer: 59 | Admitting: Family Medicine

## 2020-03-01 VITALS — BP 113/75 | HR 60 | Temp 97.8°F | Ht 67.0 in | Wt 249.0 lb

## 2020-03-01 DIAGNOSIS — Z6839 Body mass index (BMI) 39.0-39.9, adult: Secondary | ICD-10-CM | POA: Diagnosis not present

## 2020-03-01 DIAGNOSIS — F39 Unspecified mood [affective] disorder: Secondary | ICD-10-CM

## 2020-03-01 DIAGNOSIS — E559 Vitamin D deficiency, unspecified: Secondary | ICD-10-CM | POA: Diagnosis not present

## 2020-03-01 DIAGNOSIS — Z9189 Other specified personal risk factors, not elsewhere classified: Secondary | ICD-10-CM

## 2020-03-01 MED ORDER — VITAMIN D (ERGOCALCIFEROL) 1.25 MG (50000 UNIT) PO CAPS: 50000.0000 [IU] | ORAL_CAPSULE | ORAL | 0 refills | Status: DC

## 2020-03-01 MED ORDER — ESCITALOPRAM OXALATE 10 MG PO TABS: 10.0000 mg | ORAL_TABLET | Freq: Every day | ORAL | 0 refills | Status: DC

## 2020-03-02 NOTE — Progress Notes (Signed)
Chief Complaint:   OBESITY Denise Grimes is here to discuss her progress with her obesity treatment plan along with follow-up of her obesity related diagnoses. [name] is on the Category 3 Plan and states she is following her eating plan approximately 90% of the time. [name] states she is exercising 0 minutes 0 times per week.  Today's visit was #: 4 Starting weight: 260 lbs Starting date: 01/18/2020 Today's weight: 249 lbs Today's date: 03/01/2020 Total lbs lost to date: 11 lbs Total lbs lost since last in-office visit: 4 lbs Total weight loss percentage to date: -4.23%  Interim History: [name] states she is forcing herself to eat, thus this past couple of weeks she was more aware of getting the protein in. Overall, [name] is doing well without complaints of meal plan.  Plan: Increase water intake to goal of 80 oz or more per day.  Assessment/Plan:   Meds ordered this encounter  Medications  . Vitamin D, Ergocalciferol, (DRISDOL) 1.25 MG (50000 UNIT) CAPS capsule    Sig: Take 1 capsule (50,000 Units total) by mouth every 7 (seven) days.    Dispense:  4 capsule    Refill:  0  . escitalopram (LEXAPRO) 10 MG tablet    Sig: Take 1 tablet (10 mg total) by mouth daily.    Dispense:  30 tablet    Refill:  0   Lab Orders  No laboratory test(s) ordered today    1. Vitamin D deficiency Denise Grimes's Vitamin D level was 25.8 on 01/18/2020. She is currently taking prescription vitamin D 50,000 IU each week. She denies nausea, vomiting or muscle weakness.   Ref. Range 01/18/2020 08:51  Vitamin D, 25-Hydroxy Latest Ref Range: 30.0 - 100.0 ng/mL 25.8 (L)   Plan:Refill Vit D for 1 month, as per below. Low Vitamin D level contributes to fatigue and are associated with obesity, breast, and colon cancer. She agrees to continue to take prescription Vitamin D @50 ,000 IU every week and will follow-up for routine testing of Vitamin D, at least 2-3 times per year to avoid over-replacement.  Refill- Vitamin D,  Ergocalciferol, (DRISDOL) 1.25 MG (50000 UNIT) CAPS capsule; Take 1 capsule (50,000 Units total) by mouth every 7 (seven) days.  Dispense: 4 capsule; Refill: 0  2. Mood disorder (HCC), with emotional eating [name] is struggling with emotional eating and using food for comfort to the extent that it is negatively impacting her health. She has been working on behavior modification techniques to help reduce her emotional eating. She shows no sign of suicidal or homicidal ideations.  Plan: Refill Lexapro for 1 month, as per below. Continue current treatment plan. Behavior modification techniques were discussed today to help [name] deal with her emotional/non-hunger eating behaviors.  Orders and follow up as documented in patient record.   Refill- escitalopram (LEXAPRO) 10 MG tablet; Take 1 tablet (10 mg total) by mouth daily.  Dispense: 30 tablet; Refill: 0  3. At risk for side effect of medication [name] was given approximately 15 minutes of drug side effect counseling today.  We discussed side effect possibility and risk versus benefits. [name] agreed to the medication and will contact this office if these side effects are intolerable.  4. Class 2 severe obesity with serious comorbidity and body mass index (BMI) of 39.0 to 39.9 in adult, unspecified obesity type (HCC) [name] is currently in the action stage of change. As such, her goal is to continue with weight loss efforts. She has agreed to the Category 3 Plan.  Exercise goals: All adults should avoid inactivity. Some physical activity is better than none, and adults who participate in any amount of physical activity gain some health benefits.  Behavioral modification strategies: increasing lean protein intake, decreasing simple carbohydrates, meal planning and cooking strategies and planning for success.  [name] has agreed to follow-up with our clinic in 3 weeks. She was informed of the importance of frequent follow-up visits to maximize her success with  intensive lifestyle modifications for her multiple health conditions.   Objective:   Blood pressure 113/75, pulse 60, temperature 97.8 F (36.6 C), height 5\' 7"  (1.702 m), weight 249 lb (112.9 kg), last menstrual period 10/18/2010, SpO2 97 %. Body mass index is 39 kg/m.  General: Cooperative, alert, well developed, in no acute distress. HEENT: Conjunctivae and lids unremarkable. Cardiovascular: Regular rhythm.  Lungs: Normal work of breathing. Neurologic: No focal deficits.   Lab Results  Component Value Date   CREATININE 0.91 01/18/2020   BUN 10 01/18/2020   NA 138 01/18/2020   K 5.0 01/18/2020   CL 101 01/18/2020   CO2 25 01/18/2020   Lab Results  Component Value Date   ALT 22 01/18/2020   AST 15 01/18/2020   ALKPHOS 159 (H) 01/18/2020   BILITOT 0.2 01/18/2020   Lab Results  Component Value Date   HGBA1C 6.1 (H) 01/18/2020   Lab Results  Component Value Date   INSULIN 22.1 01/18/2020   Lab Results  Component Value Date   TSH 1.680 01/18/2020   Lab Results  Component Value Date   CHOL 268 (H) 01/18/2020   HDL 72 01/18/2020   LDLCALC 177 (H) 01/18/2020   TRIG 112 01/18/2020   Lab Results  Component Value Date   WBC 5.8 01/18/2020   HGB 14.9 01/18/2020   HCT 44.9 01/18/2020   MCV 87 01/18/2020   PLT 299 01/18/2020    Attestation Statements:   Reviewed by clinician on day of visit: allergies, medications, problem list, medical history, surgical history, family history, social history, and previous encounter notes.  13/03/2019, am acting as Edmund Hilda for Energy manager, DO.  I have reviewed the above documentation for accuracy and completeness, and I agree with the above. [employer], D.O.  The 21st Century Cures Act was signed into law in 2016 which includes the topic of electronic health records.  This provides immediate access to information in MyChart.  This includes consultation notes, operative notes, office notes, lab  results and pathology reports.  If you have any questions about what you read please let 2017 know at your next visit so we can discuss your concerns and take corrective action if need be.  We are right here with you.

## 2020-03-08 ENCOUNTER — Ambulatory Visit: Payer: 59 | Admitting: Psychology

## 2020-03-08 ENCOUNTER — Ambulatory Visit (INDEPENDENT_AMBULATORY_CARE_PROVIDER_SITE_OTHER): Payer: 59 | Admitting: Psychology

## 2020-03-08 DIAGNOSIS — F4323 Adjustment disorder with mixed anxiety and depressed mood: Secondary | ICD-10-CM

## 2020-03-22 ENCOUNTER — Ambulatory Visit: Payer: 59 | Admitting: Psychology

## 2020-03-23 ENCOUNTER — Encounter (INDEPENDENT_AMBULATORY_CARE_PROVIDER_SITE_OTHER): Payer: Self-pay | Admitting: Family Medicine

## 2020-03-23 ENCOUNTER — Other Ambulatory Visit: Payer: Self-pay

## 2020-03-23 ENCOUNTER — Ambulatory Visit (INDEPENDENT_AMBULATORY_CARE_PROVIDER_SITE_OTHER): Payer: 59 | Admitting: Family Medicine

## 2020-03-23 VITALS — BP 133/76 | HR 51 | Temp 98.0°F | Ht 67.0 in | Wt 248.0 lb

## 2020-03-23 DIAGNOSIS — Z6838 Body mass index (BMI) 38.0-38.9, adult: Secondary | ICD-10-CM

## 2020-03-23 DIAGNOSIS — E559 Vitamin D deficiency, unspecified: Secondary | ICD-10-CM

## 2020-03-23 DIAGNOSIS — E7849 Other hyperlipidemia: Secondary | ICD-10-CM | POA: Diagnosis not present

## 2020-03-23 DIAGNOSIS — F39 Unspecified mood [affective] disorder: Secondary | ICD-10-CM

## 2020-03-23 DIAGNOSIS — Z9189 Other specified personal risk factors, not elsewhere classified: Secondary | ICD-10-CM

## 2020-03-23 MED ORDER — VITAMIN D (ERGOCALCIFEROL) 1.25 MG (50000 UNIT) PO CAPS: 50000.0000 [IU] | ORAL_CAPSULE | ORAL | 0 refills | Status: DC

## 2020-03-23 MED ORDER — ESCITALOPRAM OXALATE 10 MG PO TABS: 10.0000 mg | ORAL_TABLET | Freq: Every day | ORAL | 0 refills | Status: DC

## 2020-03-23 MED ORDER — ATORVASTATIN CALCIUM 10 MG PO TABS: 10.0000 mg | ORAL_TABLET | Freq: Every day | ORAL | 0 refills | Status: DC

## 2020-03-24 ENCOUNTER — Other Ambulatory Visit (INDEPENDENT_AMBULATORY_CARE_PROVIDER_SITE_OTHER): Payer: Self-pay | Admitting: Family Medicine

## 2020-03-24 DIAGNOSIS — E7849 Other hyperlipidemia: Secondary | ICD-10-CM

## 2020-03-28 NOTE — Progress Notes (Addendum)
Chief Complaint:   OBESITY Denise Grimes is here to discuss her progress with her obesity treatment plan along with follow-up of her obesity related diagnoses. Denise Grimes is on the Category 3 Plan and states she is following her eating plan approximately 80% of the time. Denise Grimes states she is walking 20 minutes 5 times per week.  Today's visit was #: 5 Starting weight: 260 lbs Starting date: 01/18/2020 Today's weight: 248 lbs Today's date: 03/23/2020 Total lbs lost to date: 12 lbs Total lbs lost since last in-office visit: 1 lb Total weight loss percentage to date: -4.62%  Interim History: It has been 3 weeks since Denise Grimes's last OV. She reports for 2 weeks she was "on point" and for 1 week she did celebration eating. She also got a new job that she really wanted. She is feeling good and ready to be healthier this new year. It will be a couple of days before she can shop and start back on the plan.  Plan: Denise Grimes has an appointment with her PCP Dr. Marcello Moores at [facility] on 04/04/2020.   Assessment/Plan:    1. Other hyperlipidemia Nailah has hyperlipidemia and has been trying to improve her cholesterol levels with intensive lifestyle modification including a low saturated fat diet, exercise and weight loss. She denies any chest pain, claudication or myalgias. She is taking Lipitor.  Lab Results  Component Value Date   ALT 22 01/18/2020   AST 15 01/18/2020   ALKPHOS 159 (H) 01/18/2020   BILITOT 0.2 01/18/2020   Lab Results  Component Value Date   CHOL 268 (H) 01/18/2020   HDL 72 01/18/2020   LDLCALC 177 (H) 01/18/2020   TRIG 112 01/18/2020   Plan: Refill Lipitor for 1 month, as per below. Cardiovascular risk and specific lipid/LDL goals reviewed.  We discussed several lifestyle modifications today and Denise Grimes will continue to work on diet, exercise and weight loss efforts. Orders and follow up as documented in patient record.   Counseling Intensive lifestyle modifications are the first  line treatment for this issue. . Dietary changes: Increase soluble fiber. Decrease simple carbohydrates. . Exercise changes: Moderate to vigorous-intensity aerobic activity 150 minutes per week if tolerated. . Lipid-lowering medications: see documented in medical record.   Refill- atorvastatin (LIPITOR) 10 MG tablet; Take 1 tablet (10 mg total) by mouth at bedtime.  Dispense: 30 tablet; Refill: 0    2. Vitamin D deficiency Denise Grimes's Vitamin D level was 25.8 on 01/18/2020. She is currently taking prescription vitamin D 50,000 IU each week. She denies nausea, vomiting or muscle weakness.  Ref. Range 01/18/2020 08:51  Vitamin D, 25-Hydroxy Latest Ref Range: 30.0 - 100.0 ng/mL 25.8 (L)   Plan: Refill Vit D for 1 month, as per below. Low Vitamin D level contributes to fatigue and are associated with obesity, breast, and colon cancer. She agrees to continue to take prescription Vitamin D @50 ,000 IU every week and will follow-up for routine testing of Vitamin D, at least 2-3 times per year to avoid over-replacement.  Refill- Vitamin D, Ergocalciferol, (DRISDOL) 1.25 MG (50000 UNIT) CAPS capsule; Take 1 capsule (50,000 Units total) by mouth every 7 (seven) days.  Dispense: 4 capsule; Refill: 0    3. Mood disorder (HCC), with emotional eating Denise Grimes is struggling with emotional eating and using food for comfort to the extent that it is negatively impacting her health. She has been working on behavior modification techniques to help reduce her emotional eating. She shows no sign of  suicidal or homicidal ideations. She is taking Lexapro.  Plan: Refill Lexapro for 1 month, as per below. Behavior modification techniques were discussed today to help Denise Grimes deal with her emotional/non-hunger eating behaviors.  Orders and follow up as documented in patient record.   Refill- escitalopram (LEXAPRO) 10 MG tablet; Take 1 tablet (10 mg total) by mouth daily.  Dispense: 30 tablet; Refill: 0    4. At risk for impaired  metabolic function Denise Grimes was given approximately 15 minutes of impaired  metabolic function prevention counseling today. We discussed intensive lifestyle modifications today with an emphasis on specific nutrition and exercise instructions and strategies.   5. Class 2 severe obesity with serious comorbidity and body mass index (BMI) of 38.0 to 38.9 in adult, unspecified obesity type (HCC) Denise Grimes is currently in the action stage of change. As such, her goal is to continue with weight loss efforts. She has agreed to the Category 3 Plan.   Exercise goals:   For substantial health benefits, adults should do at least 150 minutes (2 hours and 30 minutes) a week of moderate-intensity, or 75 minutes (1 hour and 15 minutes) a week of vigorous-intensity aerobic physical activity, or an equivalent combination of moderate- and vigorous-intensity aerobic activity. Aerobic activity should be performed in episodes of at least 10 minutes, and preferably, it should be spread throughout the week.  Behavioral modification strategies: meal planning and cooking strategies, keeping healthy foods in the home, celebration eating strategies, avoiding temptations and planning for success.  Denise Grimes has agreed to follow-up with our clinic in 2 weeks. She was informed of the importance of frequent follow-up visits to maximize her success with intensive lifestyle modifications for her multiple health conditions.   Objective:   Blood pressure 133/76, pulse (!) 51, temperature 98 F (36.7 C), height 5\' 7"  (1.702 m), weight 248 lb (112.5 kg), last menstrual period 10/18/2010, SpO2 96 %. Body mass index is 38.84 kg/m.  General: Cooperative, alert, well developed, in no acute distress. HEENT: Conjunctivae and lids unremarkable. Cardiovascular: Regular rhythm.  Lungs: Normal work of breathing. Neurologic: No focal deficits.   Lab Results  Component Value Date   CREATININE 0.91 01/18/2020   BUN 10 01/18/2020   NA 138 01/18/2020    K 5.0 01/18/2020   CL 101 01/18/2020   CO2 25 01/18/2020   Lab Results  Component Value Date   ALT 22 01/18/2020   AST 15 01/18/2020   ALKPHOS 159 (H) 01/18/2020   BILITOT 0.2 01/18/2020   Lab Results  Component Value Date   HGBA1C 6.1 (H) 01/18/2020   Lab Results  Component Value Date   INSULIN 22.1 01/18/2020   Lab Results  Component Value Date   TSH 1.680 01/18/2020   Lab Results  Component Value Date   CHOL 268 (H) 01/18/2020   HDL 72 01/18/2020   LDLCALC 177 (H) 01/18/2020   TRIG 112 01/18/2020   Lab Results  Component Value Date   WBC 5.8 01/18/2020   HGB 14.9 01/18/2020   HCT 44.9 01/18/2020   MCV 87 01/18/2020   PLT 299 01/18/2020    Attestation Statements:   Reviewed by clinician on day of visit: allergies, medications, problem list, medical history, surgical history, family history, social history, and previous encounter notes.  13/03/2019, am acting as Edmund Hilda for Energy manager, DO.  I have reviewed the above documentation for accuracy and completeness, and I agree with the above. [employer], D.O.  The 21st Century Cures  Act was signed into law in 2016 which includes the topic of electronic health records.  This provides immediate access to information in MyChart.  This includes consultation notes, operative notes, office notes, lab results and pathology reports.  If you have any questions about what you read please let us know at your next visit so we can discuss your concerns and take corrective action if need be.  We are right here with you.

## 2020-04-04 ENCOUNTER — Ambulatory Visit: Payer: 59 | Admitting: Internal Medicine

## 2020-04-05 ENCOUNTER — Ambulatory Visit (INDEPENDENT_AMBULATORY_CARE_PROVIDER_SITE_OTHER): Payer: 59 | Admitting: Psychology

## 2020-04-05 DIAGNOSIS — F4323 Adjustment disorder with mixed anxiety and depressed mood: Secondary | ICD-10-CM

## 2020-04-06 ENCOUNTER — Ambulatory Visit: Payer: 59 | Admitting: Internal Medicine

## 2020-04-13 ENCOUNTER — Ambulatory Visit (INDEPENDENT_AMBULATORY_CARE_PROVIDER_SITE_OTHER): Payer: 59 | Admitting: Family Medicine

## 2020-04-16 ENCOUNTER — Other Ambulatory Visit (INDEPENDENT_AMBULATORY_CARE_PROVIDER_SITE_OTHER): Payer: Self-pay | Admitting: Family Medicine

## 2020-04-16 DIAGNOSIS — E559 Vitamin D deficiency, unspecified: Secondary | ICD-10-CM

## 2020-04-18 ENCOUNTER — Encounter (INDEPENDENT_AMBULATORY_CARE_PROVIDER_SITE_OTHER): Payer: Self-pay

## 2020-04-18 NOTE — Telephone Encounter (Signed)
MyChart message sent to pt to find out if they have enough medication to get them through until next appt.   

## 2020-04-19 ENCOUNTER — Ambulatory Visit: Payer: 59 | Admitting: Psychology

## 2020-04-26 ENCOUNTER — Other Ambulatory Visit (INDEPENDENT_AMBULATORY_CARE_PROVIDER_SITE_OTHER): Payer: Self-pay | Admitting: Family Medicine

## 2020-04-26 ENCOUNTER — Ambulatory Visit: Payer: Self-pay | Admitting: Psychology

## 2020-04-26 DIAGNOSIS — E559 Vitamin D deficiency, unspecified: Secondary | ICD-10-CM

## 2020-04-28 ENCOUNTER — Other Ambulatory Visit (INDEPENDENT_AMBULATORY_CARE_PROVIDER_SITE_OTHER): Payer: Self-pay | Admitting: Family Medicine

## 2020-04-28 DIAGNOSIS — E7849 Other hyperlipidemia: Secondary | ICD-10-CM

## 2020-04-29 ENCOUNTER — Other Ambulatory Visit (INDEPENDENT_AMBULATORY_CARE_PROVIDER_SITE_OTHER): Payer: Self-pay | Admitting: Family Medicine

## 2020-04-29 DIAGNOSIS — F39 Unspecified mood [affective] disorder: Secondary | ICD-10-CM

## 2020-05-03 ENCOUNTER — Ambulatory Visit: Payer: 59 | Admitting: Psychology

## 2020-05-16 ENCOUNTER — Ambulatory Visit (INDEPENDENT_AMBULATORY_CARE_PROVIDER_SITE_OTHER): Payer: 59 | Admitting: Family Medicine

## 2020-05-17 ENCOUNTER — Ambulatory Visit: Payer: 59 | Admitting: Psychology

## 2020-05-17 ENCOUNTER — Ambulatory Visit: Payer: Self-pay | Admitting: Psychology

## 2020-05-24 ENCOUNTER — Ambulatory Visit (INDEPENDENT_AMBULATORY_CARE_PROVIDER_SITE_OTHER): Payer: 59 | Admitting: Family Medicine

## 2020-05-31 ENCOUNTER — Ambulatory Visit: Payer: 59 | Admitting: Psychology

## 2020-06-07 ENCOUNTER — Ambulatory Visit: Payer: 59 | Admitting: Psychology

## 2020-06-14 ENCOUNTER — Ambulatory Visit: Payer: 59 | Admitting: Psychology

## 2020-06-19 ENCOUNTER — Other Ambulatory Visit: Payer: Self-pay

## 2020-06-20 ENCOUNTER — Other Ambulatory Visit: Payer: Self-pay

## 2020-06-20 ENCOUNTER — Encounter: Payer: Self-pay | Admitting: Internal Medicine

## 2020-06-20 ENCOUNTER — Ambulatory Visit: Payer: No Typology Code available for payment source | Admitting: Internal Medicine

## 2020-06-20 VITALS — BP 138/88 | HR 68 | Temp 98.1°F | Resp 16 | Ht 67.0 in | Wt 255.8 lb

## 2020-06-20 DIAGNOSIS — R10812 Left upper quadrant abdominal tenderness: Secondary | ICD-10-CM | POA: Insufficient documentation

## 2020-06-20 DIAGNOSIS — R7303 Prediabetes: Secondary | ICD-10-CM

## 2020-06-20 LAB — CBC WITH DIFFERENTIAL/PLATELET
Basophils Absolute: 0 10*3/uL (ref 0.0–0.1)
Basophils Relative: 0.8 % (ref 0.0–3.0)
Eosinophils Absolute: 0.2 10*3/uL (ref 0.0–0.7)
Eosinophils Relative: 3.6 % (ref 0.0–5.0)
HCT: 41.5 % (ref 36.0–46.0)
Hemoglobin: 13.8 g/dL (ref 12.0–15.0)
Lymphocytes Relative: 37.5 % (ref 12.0–46.0)
Lymphs Abs: 2.1 10*3/uL (ref 0.7–4.0)
MCHC: 33.3 g/dL (ref 30.0–36.0)
MCV: 86.2 fl (ref 78.0–100.0)
Monocytes Absolute: 0.6 10*3/uL (ref 0.1–1.0)
Monocytes Relative: 10.2 % (ref 3.0–12.0)
Neutro Abs: 2.7 10*3/uL (ref 1.4–7.7)
Neutrophils Relative %: 47.9 % (ref 43.0–77.0)
Platelets: 267 10*3/uL (ref 150.0–400.0)
RBC: 4.81 Mil/uL (ref 3.87–5.11)
RDW: 14.3 % (ref 11.5–15.5)
WBC: 5.7 10*3/uL (ref 4.0–10.5)

## 2020-06-20 LAB — HEPATIC FUNCTION PANEL
ALT: 22 U/L (ref 0–35)
AST: 16 U/L (ref 0–37)
Albumin: 4.2 g/dL (ref 3.5–5.2)
Alkaline Phosphatase: 108 U/L (ref 39–117)
Bilirubin, Direct: 0.1 mg/dL (ref 0.0–0.3)
Total Bilirubin: 0.4 mg/dL (ref 0.2–1.2)
Total Protein: 7.4 g/dL (ref 6.0–8.3)

## 2020-06-20 LAB — BASIC METABOLIC PANEL
BUN: 12 mg/dL (ref 6–23)
CO2: 29 mEq/L (ref 19–32)
Calcium: 9 mg/dL (ref 8.4–10.5)
Chloride: 100 mEq/L (ref 96–112)
Creatinine, Ser: 0.85 mg/dL (ref 0.40–1.20)
GFR: 75.75 mL/min (ref >60.00)
Glucose, Bld: 97 mg/dL (ref 70–99)
Potassium: 4.3 mEq/L (ref 3.5–5.1)
Sodium: 135 mEq/L (ref 135–145)

## 2020-06-20 LAB — LIPASE: Lipase: 37 U/L (ref 11.0–59.0)

## 2020-06-20 LAB — URINALYSIS, ROUTINE W REFLEX MICROSCOPIC
Bilirubin Urine: NEGATIVE
Hgb urine dipstick: NEGATIVE
Ketones, ur: NEGATIVE
Leukocytes,Ua: NEGATIVE
Nitrite: NEGATIVE
RBC / HPF: NONE SEEN (ref 0–?)
Specific Gravity, Urine: 1.01 (ref 1.000–1.030)
Total Protein, Urine: NEGATIVE
Urine Glucose: NEGATIVE
Urobilinogen, UA: 0.2 (ref 0.0–1.0)
pH: 7.5 (ref 5.0–8.0)

## 2020-06-20 LAB — AMYLASE: Amylase: 25 U/L — ABNORMAL LOW (ref 27–131)

## 2020-06-20 LAB — C-REACTIVE PROTEIN: CRP: 1 mg/dL (ref 0.5–20.0)

## 2020-06-20 LAB — HEMOGLOBIN A1C: Hgb A1c MFr Bld: 6.1 % (ref 4.6–6.5)

## 2020-06-20 NOTE — Patient Instructions (Signed)
Abdominal Pain, Adult Pain in the abdomen (abdominal pain) can be caused by many things. Often, abdominal pain is not serious and it gets better with no treatment or by being treated at home. However, sometimes abdominal pain is serious. Your health care provider will ask questions about your medical history and do a physical exam to try to determine the cause of your abdominal pain. Follow these instructions at home: Medicines  Take over-the-counter and prescription medicines only as told by your health care provider.  Do not take a laxative unless told by your health care provider. General instructions  Watch your condition for any changes.  Drink enough fluid to keep your urine pale yellow.  Keep all follow-up visits as told by your health care provider. This is important.   Contact a health care provider if:  Your abdominal pain changes or gets worse.  You are not hungry or you lose weight without trying.  You are constipated or have diarrhea for more than 2-3 days.  You have pain when you urinate or have a bowel movement.  Your abdominal pain wakes you up at night.  Your pain gets worse with meals, after eating, or with certain foods.  You are vomiting and cannot keep anything down.  You have a fever.  You have blood in your urine. Get help right away if:  Your pain does not go away as soon as your health care provider told you to expect.  You cannot stop vomiting.  Your pain is only in areas of the abdomen, such as the right side or the left lower portion of the abdomen. Pain on the right side could be caused by appendicitis.  You have bloody or black stools, or stools that look like tar.  You have severe pain, cramping, or bloating in your abdomen.  You have signs of dehydration, such as: ? Dark urine, very little urine, or no urine. ? Cracked lips. ? Dry mouth. ? Sunken eyes. ? Sleepiness. ? Weakness.  You have trouble breathing or chest  pain. Summary  Often, abdominal pain is not serious and it gets better with no treatment or by being treated at home. However, sometimes abdominal pain is serious.  Watch your condition for any changes.  Take over-the-counter and prescription medicines only as told by your health care provider.  Contact a health care provider if your abdominal pain changes or gets worse.  Get help right away if you have severe pain, cramping, or bloating in your abdomen. This information is not intended to replace advice given to you by your health care provider. Make sure you discuss any questions you have with your health care provider. Document Revised: 04/24/2019 Document Reviewed: 07/14/2018 Elsevier Patient Education  2021 Elsevier Inc.  

## 2020-06-20 NOTE — Progress Notes (Signed)
Subjective:  Patient ID: Denise Grimes, female    DOB: 08/13/1960  Age: 60 y.o. MRN: [id number]  CC: New Patient (Initial Visit) (Establish care with PCP. Discuss refills for Vitamin D and Lexapro) and Abdominal Pain  This visit occurred during the SARS-CoV-2 public health emergency.  Safety protocols were in place, including screening questions prior to the visit, additional usage of staff PPE, and extensive cleaning of exam room while observing appropriate contact time as indicated for disinfecting solutions.    HPI Denise Grimes presents for establishing.  She complains of a 1 week hx of vague discomfort in her left upper abdomen - she describes it as a pinching/pressure sensation.  History Denise Grimes has a past medical history of Depression and Overweight.   She has a past surgical history that includes Cesarean section and Appendectomy.   Her family history includes Cancer in her mother; Diabetes in her father; Heart disease in her father; Hyperlipidemia in her father; Hypertension in her father; Obesity in her father and mother; Sudden death in her father; Thyroid disease in her mother.She reports that she quit smoking about 30 years ago. Her smoking use included cigarettes. She has never used smokeless tobacco. No history on file for alcohol use and drug use.  Outpatient Medications Prior to Visit  Medication Sig Dispense Refill  . atorvastatin (LIPITOR) 10 MG tablet Take 1 tablet (10 mg total) by mouth at bedtime. 30 tablet 0  . escitalopram (LEXAPRO) 10 MG tablet Take 1 tablet (10 mg total) by mouth daily. 30 tablet 0  . Multiple Vitamin (MULTIVITAMIN) tablet Take 1 tablet by mouth daily.    . Vitamin D, Ergocalciferol, (DRISDOL) 1.25 MG (50000 UNIT) CAPS capsule Take 1 capsule (50,000 Units total) by mouth every 7 (seven) days. 4 capsule 0   No facility-administered medications prior to visit.    ROS Review of Systems  Constitutional: Negative for chills, diaphoresis, fatigue and  fever.  HENT: Negative.   Eyes: Negative.   Respiratory: Negative for cough, chest tightness, shortness of breath and wheezing.   Cardiovascular: Negative for chest pain, palpitations and leg swelling.  Gastrointestinal: Positive for abdominal pain. Negative for blood in stool, constipation, diarrhea, nausea and vomiting.  Endocrine: Negative.   Genitourinary: Negative.  Negative for difficulty urinating, dysuria and hematuria.  Musculoskeletal: Negative for arthralgias.  Skin: Negative.   Neurological: Negative.  Negative for dizziness, weakness and light-headedness.  Hematological: Negative for adenopathy. Does not bruise/bleed easily.  Psychiatric/Behavioral: Negative.     Objective:  BP 138/88 (BP Location: Left Arm, Patient Position: Sitting, Cuff Size: Large)   Pulse 68   Temp 98.1 F (36.7 C) (Oral)   Resp 16   Ht 5\' 7"  (1.702 m)   Wt 255 lb 12.8 oz (116 kg)   LMP 10/18/2010   SpO2 95%   BMI 40.06 kg/m   Physical Exam Vitals reviewed.  Constitutional:      General: She is not in acute distress.    Appearance: She is not ill-appearing, toxic-appearing or diaphoretic.  HENT:     Nose: Nose normal.  Eyes:     General: No scleral icterus.    Conjunctiva/sclera: Conjunctivae normal.  Cardiovascular:     Rate and Rhythm: Normal rate and regular rhythm.     Heart sounds: No murmur heard.   Pulmonary:     Effort: Pulmonary effort is normal.     Breath sounds: No stridor. No wheezing, rhonchi or rales.  Abdominal:     General: Abdomen is  flat. Bowel sounds are normal. There is no distension.     Palpations: Abdomen is soft.     Tenderness: There is abdominal tenderness in the left upper quadrant. There is no guarding or rebound.  Musculoskeletal:        General: Normal range of motion.     Cervical back: Neck supple.     Right lower leg: No edema.     Left lower leg: No edema.  Lymphadenopathy:     Cervical: No cervical adenopathy.  Skin:    General: Skin is  warm and dry.     Coloration: Skin is not pale.  Neurological:     General: No focal deficit present.     Mental Status: She is alert.  Psychiatric:        Mood and Affect: Mood normal.        Behavior: Behavior normal.      Lab Results  Component Value Date   WBC 5.7 06/20/2020   HGB 13.8 06/20/2020   HCT 41.5 06/20/2020   PLT 267.0 06/20/2020   GLUCOSE 97 06/20/2020   CHOL 268 (H) 01/18/2020   TRIG 112 01/18/2020   HDL 72 01/18/2020   LDLCALC 177 (H) 01/18/2020   ALT 22 06/20/2020   AST 16 06/20/2020   NA 135 06/20/2020   K 4.3 06/20/2020   CL 100 06/20/2020   CREATININE 0.85 06/20/2020   BUN 12 06/20/2020   CO2 29 06/20/2020   TSH 1.680 01/18/2020   HGBA1C 6.1 06/20/2020     Assessment & Plan:   Denise Grimes was seen today for new patient (initial visit) and abdominal pain.  Diagnoses and all orders for this visit:  Left upper quadrant abdominal tenderness without rebound tenderness- She has LUQ TTP but no other s/s, her labs are reassuring. Will get a CT with contrast to see if there is a splenic mass or infarct, occult infection or malignancy, or LAD. -     CBC with Differential/Platelet; Future -     Lipase; Future -     Amylase; Future -     Urinalysis, Routine w reflex microscopic; Future -     Hepatic function panel; Future -     C-reactive protein; Future -     C-reactive protein -     Hepatic function panel -     Urinalysis, Routine w reflex microscopic -     Amylase -     Lipase -     CBC with Differential/Platelet -     CT Abdomen Pelvis W Contrast; Future  Prediabetes- A1C is 6.1%. Medical tx is not indicated. -     Basic metabolic panel; Future -     Hemoglobin A1c; Future -     Hemoglobin A1c -     Basic metabolic panel   I am having Denise Grimes maintain her Vitamin D (Ergocalciferol), escitalopram, atorvastatin, and multivitamin.  No orders of the defined types were placed in this encounter.    Follow-up: Return in about 4 weeks (around  07/18/2020).  Sanda Linger, MD

## 2020-06-21 ENCOUNTER — Other Ambulatory Visit: Payer: Self-pay | Admitting: Internal Medicine

## 2020-06-21 DIAGNOSIS — E7849 Other hyperlipidemia: Secondary | ICD-10-CM

## 2020-06-21 DIAGNOSIS — E785 Hyperlipidemia, unspecified: Secondary | ICD-10-CM

## 2020-06-21 DIAGNOSIS — F39 Unspecified mood [affective] disorder: Secondary | ICD-10-CM

## 2020-06-21 DIAGNOSIS — E559 Vitamin D deficiency, unspecified: Secondary | ICD-10-CM

## 2020-06-21 MED ORDER — VITAMIN D (ERGOCALCIFEROL) 1.25 MG (50000 UNIT) PO CAPS: 50000.0000 [IU] | ORAL_CAPSULE | ORAL | 0 refills | Status: DC

## 2020-06-21 MED ORDER — ATORVASTATIN CALCIUM 10 MG PO TABS: 10.0000 mg | ORAL_TABLET | Freq: Every day | ORAL | 1 refills | Status: DC

## 2020-06-21 MED ORDER — ESCITALOPRAM OXALATE 10 MG PO TABS: 10.0000 mg | ORAL_TABLET | Freq: Every day | ORAL | 1 refills | Status: DC

## 2020-06-28 ENCOUNTER — Ambulatory Visit: Payer: 59 | Admitting: Psychology

## 2020-07-05 ENCOUNTER — Other Ambulatory Visit: Payer: Self-pay | Admitting: Internal Medicine

## 2020-07-05 DIAGNOSIS — R10812 Left upper quadrant abdominal tenderness: Secondary | ICD-10-CM

## 2020-07-06 ENCOUNTER — Other Ambulatory Visit: Payer: Self-pay | Admitting: Internal Medicine

## 2020-07-06 DIAGNOSIS — R10812 Left upper quadrant abdominal tenderness: Secondary | ICD-10-CM

## 2020-07-20 ENCOUNTER — Other Ambulatory Visit: Payer: 59

## 2020-08-01 ENCOUNTER — Other Ambulatory Visit: Payer: 59

## 2020-08-03 ENCOUNTER — Other Ambulatory Visit: Payer: Self-pay | Admitting: Internal Medicine

## 2020-08-03 ENCOUNTER — Ambulatory Visit
Admission: RE | Admit: 2020-08-03 | Discharge: 2020-08-03 | Disposition: A | Discharge status: Home / Self Care | Payer: 59 | Source: Ambulatory Visit | Attending: Internal Medicine | Admitting: Internal Medicine

## 2020-08-03 DIAGNOSIS — E559 Vitamin D deficiency, unspecified: Secondary | ICD-10-CM

## 2020-08-03 DIAGNOSIS — R10812 Left upper quadrant abdominal tenderness: Secondary | ICD-10-CM

## 2020-08-03 MED ORDER — VITAMIN D (ERGOCALCIFEROL) 1.25 MG (50000 UNIT) PO CAPS: 50000.0000 [IU] | ORAL_CAPSULE | ORAL | 0 refills | Status: DC

## 2020-09-07 ENCOUNTER — Other Ambulatory Visit: Payer: Self-pay | Admitting: Internal Medicine

## 2020-09-07 ENCOUNTER — Encounter: Payer: Self-pay | Admitting: Internal Medicine

## 2020-10-12 ENCOUNTER — Ambulatory Visit: Payer: 59 | Admitting: Internal Medicine

## 2020-11-17 ENCOUNTER — Encounter: Payer: 59 | Admitting: Obstetrics & Gynecology

## 2020-12-06 ENCOUNTER — Other Ambulatory Visit: Payer: Self-pay | Admitting: Internal Medicine

## 2020-12-06 DIAGNOSIS — E559 Vitamin D deficiency, unspecified: Secondary | ICD-10-CM

## 2021-02-03 ENCOUNTER — Other Ambulatory Visit: Payer: Self-pay | Admitting: Internal Medicine

## 2021-02-03 DIAGNOSIS — E559 Vitamin D deficiency, unspecified: Secondary | ICD-10-CM

## 2021-02-06 ENCOUNTER — Encounter: Payer: Self-pay | Admitting: Internal Medicine

## 2021-02-06 MED ORDER — ONE-DAILY MULTI VITAMINS PO TABS: 1.0000 | ORAL_TABLET | Freq: Every day | ORAL | 1 refills | Status: DC

## 2021-02-07 ENCOUNTER — Other Ambulatory Visit: Payer: Self-pay | Admitting: Internal Medicine

## 2021-02-07 DIAGNOSIS — F39 Unspecified mood [affective] disorder: Secondary | ICD-10-CM

## 2021-02-07 MED ORDER — ESCITALOPRAM OXALATE 10 MG PO TABS
10.0000 mg | ORAL_TABLET | Freq: Every day | ORAL | 0 refills | Status: DC
Start: 2021-02-07 — End: 2021-06-13

## 2021-06-13 ENCOUNTER — Other Ambulatory Visit: Payer: Self-pay | Admitting: Internal Medicine

## 2021-06-13 DIAGNOSIS — F39 Unspecified mood [affective] disorder: Secondary | ICD-10-CM

## 2021-08-16 ENCOUNTER — Ambulatory Visit: Payer: 59 | Admitting: Internal Medicine

## 2021-10-24 ENCOUNTER — Other Ambulatory Visit: Payer: Self-pay | Admitting: Internal Medicine

## 2021-10-24 DIAGNOSIS — F39 Unspecified mood [affective] disorder: Secondary | ICD-10-CM

## 2021-10-25 ENCOUNTER — Encounter (INDEPENDENT_AMBULATORY_CARE_PROVIDER_SITE_OTHER): Payer: Self-pay

## 2021-12-12 ENCOUNTER — Ambulatory Visit (INDEPENDENT_AMBULATORY_CARE_PROVIDER_SITE_OTHER): Payer: 59 | Admitting: Family Medicine

## 2021-12-12 ENCOUNTER — Encounter (INDEPENDENT_AMBULATORY_CARE_PROVIDER_SITE_OTHER): Payer: Self-pay | Admitting: Family Medicine

## 2021-12-12 VITALS — BP 138/87 | HR 69 | Temp 98.1°F | Ht 66.0 in | Wt 260.0 lb

## 2021-12-12 DIAGNOSIS — Z6841 Body Mass Index (BMI) 40.0 and over, adult: Secondary | ICD-10-CM

## 2021-12-12 DIAGNOSIS — R5383 Other fatigue: Secondary | ICD-10-CM

## 2021-12-12 DIAGNOSIS — Z0289 Encounter for other administrative examinations: Secondary | ICD-10-CM

## 2021-12-13 DIAGNOSIS — R5383 Other fatigue: Secondary | ICD-10-CM | POA: Insufficient documentation

## 2021-12-13 NOTE — Progress Notes (Addendum)
Denise Grimes, D.O. [address] [place]. [place],  [place]  [address] Office: [phone]  /  Fax: [phone]    Initial Bariatric Medicine Consultation Visit  Denise Grimes was seen in clinic today to evaluate for obesity. She is interested in losing weight to improve overall health and reduce the risk of weight related complications.   She presents today to review program treatment options, initial physical assessment, and evaluation.  Of note, she was initially seen by Dr Denise Grimes on 01/18/20 in the past for weight management. She was last seen by me on 03/23/20 and subsequently, lost to follow up.  Currently works for [employer]    Patient Active Problem List   Diagnosis Date Noted   Other fatigue 12/13/2021   At risk for impaired metabolic function 02/15/2020   Mood disorder (HCC) 02/01/2020   Vitamin D deficiency 02/01/2020   Elevated lipoprotein(a) 02/01/2020   Family history of early CAD 02/01/2020   Other hyperlipidemia 02/01/2020   Prediabetes 02/01/2020   At risk for heart disease 02/01/2020     Past Surgical History:  Procedure Laterality Date   APPENDECTOMY     CESAREAN SECTION       No current outpatient medications on file.   No Known Allergies     Objective:  BP 138/87   Pulse 69   Temp 98.1 F (36.7 C)   Ht 5\' 6"  (1.676 m)   Wt 260 lb (117.9 kg)   LMP 10/18/2010   SpO2 98%   BMI 41.97 kg/m   She was weighed on the bioimpedance scale:   - Body mass index is 41.97 kg/m.  - Visceral fat rating: 16   General: Well Developed, well nourished, and in no acute distress.  HEENT: Normocephalic, atraumatic Skin: Warm and dry, cap RF less 2 sec, good turgor Chest:  Normal excursion, shape, no gross abn Respiratory: speaking in full sentences, no conversational dyspnea NeuroM-Sk: Ambulates w/o assistance, moves * 4 Psych: A and O *3, insight good, mood-full    Assessment and Plan:   1. Class 3 severe obesity with serious comorbidity  and body mass index (BMI) of 40.0 to 44.9 in adult, unspecified obesity type (HCC)  2. Other fatigue ECG ordered today       Obesity Treatment Plan:  She was encouraged to garner support from family and friends to begin weight loss journey. Work on eliminating or reducing the presence of highly processed, calorie dense foods in the home. Complete provided nutritional and psychosocial assessment questionnaire.    Denise Grimes will follow up in the next 1-2 weeks to review the above steps and continue evaluation and treatment.   Obesity Education Performed Today:  She was weighed on the bioimpedance scale and results were discussed and documented in the synopsis.  We discussed obesity as a disease and the importance of a more detailed evaluation of all the factors contributing to the disease.  We discussed the importance of long term lifestyle changes which include nutrition, exercise and behavioral modifications as well as the importance of customizing this to her specific health and social needs.  We discussed the benefits of reaching a healthier weight to alleviate the symptoms of existing conditions and reduce the risks of the biomechanical, metabolic and psychological effects of obesity.  We discussed the goals of this program is to improve her overall health and prevent disease; not simply achieve a specific BMI.  Frequent visits are very important to patient success. I plan to see her  every 2 weeks for the first 3 months and then evaluate the visit frequency after that time. I explained obesity is a life-long chronic disease and long term treatments would be required.  Educated Denise Grimes that anti-obesity medications to help her follow the eating plan may be offered in future as medically appropriate but are not always indicated.  All medication decisions will be made together after the initial workup is done and benefits and side effects are discussed in depth.  The clinic policies  were reviewed with the patient including the late, no-show and cancellation policies, as well as any applicable initial program fees.  Denise Grimes appears to currently be in the action stage of change, and states she is ready to start intensive lifestyle and behavorial modifications necessary to achieve their weight loss and health goals.   40 minutes were spent today on this visit including the above counseling, all applicable pre- and post- visit chart review, and in medical documentation  Denise Grimes, D.O.  The [place] was signed into law in 2016 which includes the topic of electronic health records.  This provides immediate access to information in MyChart.  This includes consultation notes, operative notes, office notes, lab results and pathology reports.  If you have any questions about what you read please let us know at your next visit so we can discuss your concerns and take corrective action if need be.   We are right here with you!

## 2021-12-20 ENCOUNTER — Encounter (INDEPENDENT_AMBULATORY_CARE_PROVIDER_SITE_OTHER): Payer: 59 | Admitting: Family Medicine

## 2022-01-02 ENCOUNTER — Encounter (INDEPENDENT_AMBULATORY_CARE_PROVIDER_SITE_OTHER): Payer: Self-pay | Admitting: Family Medicine

## 2022-01-02 ENCOUNTER — Ambulatory Visit (INDEPENDENT_AMBULATORY_CARE_PROVIDER_SITE_OTHER): Payer: 59 | Admitting: Family Medicine

## 2022-01-02 VITALS — BP 132/84 | HR 78 | Temp 98.0°F | Ht 67.0 in | Wt 260.0 lb

## 2022-01-02 DIAGNOSIS — R0602 Shortness of breath: Secondary | ICD-10-CM

## 2022-01-02 DIAGNOSIS — E559 Vitamin D deficiency, unspecified: Secondary | ICD-10-CM | POA: Diagnosis not present

## 2022-01-02 DIAGNOSIS — Z1331 Encounter for screening for depression: Secondary | ICD-10-CM

## 2022-01-02 DIAGNOSIS — R7303 Prediabetes: Secondary | ICD-10-CM | POA: Diagnosis not present

## 2022-01-02 DIAGNOSIS — E7849 Other hyperlipidemia: Secondary | ICD-10-CM

## 2022-01-02 DIAGNOSIS — Z9189 Other specified personal risk factors, not elsewhere classified: Secondary | ICD-10-CM

## 2022-01-02 DIAGNOSIS — R5383 Other fatigue: Secondary | ICD-10-CM | POA: Diagnosis not present

## 2022-01-02 DIAGNOSIS — F5089 Other specified eating disorder: Secondary | ICD-10-CM | POA: Diagnosis not present

## 2022-01-02 DIAGNOSIS — Z6841 Body Mass Index (BMI) 40.0 and over, adult: Secondary | ICD-10-CM

## 2022-01-03 LAB — CBC WITH DIFFERENTIAL/PLATELET
Basophils Absolute: 0.1 10*3/uL (ref 0.0–0.2)
Basos: 1 %
EOS (ABSOLUTE): 0.3 10*3/uL (ref 0.0–0.4)
Eos: 5 %
Hematocrit: 44.5 % (ref 34.0–46.6)
Hemoglobin: 15.4 g/dL (ref 11.1–15.9)
Immature Grans (Abs): 0 10*3/uL (ref 0.0–0.1)
Immature Granulocytes: 0 %
Lymphocytes Absolute: 2.2 10*3/uL (ref 0.7–3.1)
Lymphs: 37 %
MCH: 30.3 pg (ref 26.6–33.0)
MCHC: 34.6 g/dL (ref 31.5–35.7)
MCV: 87 fL (ref 79–97)
Monocytes Absolute: 0.6 10*3/uL (ref 0.1–0.9)
Monocytes: 11 %
Neutrophils Absolute: 2.8 10*3/uL (ref 1.4–7.0)
Neutrophils: 46 %
Platelets: 271 10*3/uL (ref 150–450)
RBC: 5.09 x10E6/uL (ref 3.77–5.28)
RDW: 12.8 % (ref 11.7–15.4)
WBC: 5.9 10*3/uL (ref 3.4–10.8)

## 2022-01-03 LAB — COMPREHENSIVE METABOLIC PANEL
ALT: 29 IU/L (ref 0–32)
AST: 20 IU/L (ref 0–40)
Albumin/Globulin Ratio: 1.6 (ref 1.2–2.2)
Albumin: 4.6 g/dL (ref 3.8–4.9)
Alkaline Phosphatase: 153 IU/L — ABNORMAL HIGH (ref 44–121)
BUN/Creatinine Ratio: 16 (ref 9–23)
BUN: 14 mg/dL (ref 6–24)
Bilirubin Total: 0.5 mg/dL (ref 0.0–1.2)
CO2: 21 mmol/L (ref 20–29)
Calcium: 9.2 mg/dL (ref 8.7–10.2)
Chloride: 99 mmol/L (ref 96–106)
Creatinine, Ser: 0.88 mg/dL (ref 0.57–1.00)
Globulin, Total: 2.9 g/dL (ref 1.5–4.5)
Glucose: 99 mg/dL (ref 70–99)
Potassium: 4.8 mmol/L (ref 3.5–5.2)
Sodium: 138 mmol/L (ref 134–144)
Total Protein: 7.5 g/dL (ref 6.0–8.5)
eGFR: 76 mL/min/{1.73_m2} (ref >59)

## 2022-01-03 LAB — FOLATE: Folate: 8.1 ng/mL (ref >3.0)

## 2022-01-03 LAB — HEMOGLOBIN A1C
Est. average glucose Bld gHb Est-mCnc: 131 mg/dL
Hgb A1c MFr Bld: 6.2 % — ABNORMAL HIGH (ref 4.8–5.6)

## 2022-01-03 LAB — LIPID PANEL WITH LDL/HDL RATIO
Cholesterol, Total: 257 mg/dL — ABNORMAL HIGH (ref 100–199)
HDL: 77 mg/dL (ref >39)
LDL Chol Calc (NIH): 159 mg/dL — ABNORMAL HIGH (ref 0–99)
LDL/HDL Ratio: 2.1 ratio (ref 0.0–3.2)
Triglycerides: 120 mg/dL (ref 0–149)
VLDL Cholesterol Cal: 21 mg/dL (ref 5–40)

## 2022-01-03 LAB — T4, FREE: Free T4: 1.16 ng/dL (ref 0.82–1.77)

## 2022-01-03 LAB — INSULIN, RANDOM: INSULIN: 24 u[IU]/mL (ref 2.6–24.9)

## 2022-01-03 LAB — TSH: TSH: 1.85 u[IU]/mL (ref 0.450–4.500)

## 2022-01-03 LAB — VITAMIN B12: Vitamin B-12: 347 pg/mL (ref 232–1245)

## 2022-01-03 LAB — VITAMIN D 25 HYDROXY (VIT D DEFICIENCY, FRACTURES): Vit D, 25-Hydroxy: 24.1 ng/mL — ABNORMAL LOW (ref 30.0–100.0)

## 2022-01-16 ENCOUNTER — Encounter (INDEPENDENT_AMBULATORY_CARE_PROVIDER_SITE_OTHER): Payer: Self-pay | Admitting: Family Medicine

## 2022-01-16 ENCOUNTER — Ambulatory Visit (INDEPENDENT_AMBULATORY_CARE_PROVIDER_SITE_OTHER): Payer: 59 | Admitting: Family Medicine

## 2022-01-16 VITALS — BP 133/83 | HR 70 | Temp 98.2°F | Ht 67.0 in | Wt 265.0 lb

## 2022-01-16 DIAGNOSIS — E7849 Other hyperlipidemia: Secondary | ICD-10-CM

## 2022-01-16 DIAGNOSIS — R7303 Prediabetes: Secondary | ICD-10-CM

## 2022-01-16 DIAGNOSIS — E559 Vitamin D deficiency, unspecified: Secondary | ICD-10-CM

## 2022-01-16 DIAGNOSIS — E538 Deficiency of other specified B group vitamins: Secondary | ICD-10-CM

## 2022-01-16 DIAGNOSIS — F39 Unspecified mood [affective] disorder: Secondary | ICD-10-CM

## 2022-01-16 DIAGNOSIS — Z6841 Body Mass Index (BMI) 40.0 and over, adult: Secondary | ICD-10-CM

## 2022-01-16 DIAGNOSIS — E669 Obesity, unspecified: Secondary | ICD-10-CM

## 2022-01-16 MED ORDER — VITAMIN D (ERGOCALCIFEROL) 1.25 MG (50000 UNIT) PO CAPS: 50000.0000 [IU] | ORAL_CAPSULE | ORAL | 0 refills | Status: DC

## 2022-01-16 MED ORDER — CYANOCOBALAMIN 500 MCG PO TABS: 500.0000 ug | ORAL_TABLET | Freq: Every day | ORAL | Status: AC

## 2022-01-16 NOTE — Patient Instructions (Signed)
The 10-year ASCVD risk score (Arnett DK, et al., 2019) is: 3.1%   Values used to calculate the score:     Age: 61 years     Sex: Female     Is Non-Hispanic African American: No     Diabetic: No     Tobacco smoker: No     Systolic Blood Pressure: 952 mmHg     Is BP treated: No     HDL Cholesterol: 77 mg/dL     Total Cholesterol: 257 mg/dL

## 2022-01-18 ENCOUNTER — Ambulatory Visit (HOSPITAL_BASED_OUTPATIENT_CLINIC_OR_DEPARTMENT_OTHER): Payer: 59 | Admitting: Nurse Practitioner

## 2022-01-23 NOTE — Progress Notes (Signed)
Chief Complaint:   OBESITY Denise Grimes (MR# [id number]) is a 61 y.o. female who presents for evaluation and treatment of obesity and related comorbidities. Current BMI is Body mass index is 40.72 kg/m. Denise Grimes has been struggling with her weight for many years and has been unsuccessful in either losing weight, maintaining weight loss, or reaching her healthy weight goal.  Denise Grimes is currently in the action stage of change and ready to dedicate time achieving and maintaining a healthier weight. Denise Grimes is interested in becoming our patient and working on intensive lifestyle modifications including (but not limited to) diet and exercise for weight loss.  Denise Grimes is divorced and lives alone. She craves [place] and eats out 3 days a week. Pt snacks frequently. She gained weight 10 years ago when she moved here and started socializing more and was an empty nester at home. Pt wants to get healthy mentally and physically.  Denise Grimes's habits were reviewed today and are as follows: her desired weight loss is 70 lbs, she started gaining weight in 2013, her heaviest weight ever was 265 pounds, she has significant food cravings issues, she is frequently drinking liquids with calories, and she struggles with emotional eating.  Depression Screen Denise Grimes's Food and Mood (modified PHQ-9) score was 8.     01/02/2022    8:37 AM  Depression screen PHQ 2/9  Decreased Interest 2  Down, Depressed, Hopeless 2  PHQ - 2 Score 4  Altered sleeping 0  Tired, decreased energy 2  Change in appetite 2  Feeling bad or failure about yourself  0  Trouble concentrating 0  Moving slowly or fidgety/restless 0  Suicidal thoughts 0  PHQ-9 Score 8  Difficult doing work/chores Not difficult at all   Subjective:   1. Other fatigue Denise Grimes admits to daytime somnolence and denies waking up still tired. Patient has a history of symptoms of daytime fatigue. Denise Grimes generally gets 8 or 9 hours of sleep per night, and states that she has  generally restful sleep. Snoring is present. [name] episodes are not present. Epworth Sleepiness Score is 2.  ECG with normal sinus rhythm, rate 75, low voltage.  2. SOB (shortness of breath) on exertion Denise Grimes notes increasing shortness of breath with exercising and seems to be worsening over time with weight gain. She notes getting out of breath sooner with activity than she used to. This has gotten worse recently. Denise Grimes denies shortness of breath at rest or orthopnea. IC= higher than expected= great! Measured= 2146 and calculated at 1931. Pt needs to move.  3. Prediabetes Denise Grimes has a diagnosis of prediabetes based on her elevated HgA1c and was informed this puts her at greater risk of developing diabetes. She continues to work on diet and exercise to decrease her risk of diabetes. She denies nausea or hypoglycemia. Medication: None  4. Other hyperlipidemia Denise Grimes has hyperlipidemia and has been trying to improve her cholesterol levels with intensive lifestyle modifications including a low saturated fat diet, exercise, and weight loss. She denies any chest pain, claudication, or myalgias. Medication: None  5. Vitamin D deficiency She is currently taking no vitamin D supplement. She denies nausea, vomiting or muscle weakness.  6. Other disorder of eating, with emotional eating Not at goal. Medication: None.  7. At risk for diabetes mellitus Denise Grimes is at risk for diabetes mellitus due to pre-diabetes.  Assessment/Plan:   Orders Placed This Encounter  Procedures   VITAMIN D 25 Hydroxy (Vit-D Deficiency, Fractures)   TSH  T4, free   Lipid Panel With LDL/HDL Ratio   Insulin, random   Hemoglobin A1c   Folate   Comprehensive metabolic panel   CBC with Differential/Platelet   Vitamin B12   EKG 12-Lead    There are no discontinued medications.   No orders of the defined types were placed in this encounter.    1. Other fatigue Denise Grimes does feel that her weight is causing her energy to be  lower than it should be. Fatigue may be related to obesity, depression or many other causes. Labs will be ordered, and in the meanwhile, Denise Grimes will focus on self care including making healthy food choices, increasing physical activity and focusing on stress reduction.  Lab/Orders today: - EKG 12-Lead - TSH - T4, free - Folate - Comprehensive metabolic panel - CBC with Differential/Platelet - Vitamin B12  2. SOB (shortness of breath) on exertion Denise Grimes does feel that she gets out of breath more easily that she used to when she exercises. Denise Grimes shortness of breath appears to be obesity related and exercise induced. She has agreed to work on weight loss and gradually increase exercise to treat her exercise induced shortness of breath. Will continue to monitor closely.  3. Prediabetes Denise Grimes will continue to work on weight loss, exercise, and decreasing simple carbohydrates to help decrease the risk of diabetes.   Lab/Orders today: - Insulin, random - Hemoglobin A1c  4. Other hyperlipidemia Cardiovascular risk and specific lipid/LDL goals reviewed.  We discussed several lifestyle modifications today and Denise Grimes will continue to work on diet, exercise and weight loss efforts. Orders and follow up as documented in patient record.   Counseling Intensive lifestyle modifications are the first line treatment for this issue. Dietary changes: Increase soluble fiber. Decrease simple carbohydrates. Exercise changes: Moderate to vigorous-intensity aerobic activity 150 minutes per week if tolerated. Lipid-lowering medications: see documented in medical record.  Lab/Orders today: - Lipid Panel With LDL/HDL Ratio - Comprehensive metabolic panel - CBC with Differential/Platelet  5. Vitamin D deficiency Low Vitamin D level contributes to fatigue and are associated with obesity, breast, and colon cancer. She will follow-up for routine testing of Vitamin D, at least 2-3 times per year to avoid  over-replacement.  Lab/Orders today: - VITAMIN D 25 Hydroxy (Vit-D Deficiency, Fractures)  6. Other disorder of eating, with emotional eating Behavior modification techniques were discussed today to help Denise Grimes deal with her emotional/non-hunger eating behaviors.  Orders and follow up as documented in patient record.  Referral to [facility], as pt would like more of a life coach , in addition to emotional eating counseling.  7. Depression screening Denise Grimes had a positive depression screening. Depression is commonly associated with obesity and often results in emotional eating behaviors. We will monitor this closely and work on CBT to help improve the non-hunger eating patterns. Referral to Psychology may be required if no improvement is seen as she continues in our clinic.  8. At risk for diabetes mellitus - Denise Grimes was given diabetes prevention education and counseling today of more than 30 minutes.  - Counseled patient on pathophysiology of disease and meaning/ implication of lab results.  - Reviewed how certain foods can either stimulate or inhibit insulin release, and subsequently affect hunger pathways  - Importance of following a healthy meal plan with limiting amounts of simple carbohydrates discussed with patient - Effects of regular aerobic exercise on blood sugar regulation reviewed and encouraged an eventual goal of 30 min 5d/week or more as a minimum.  -  Briefly discussed treatment options, which always include dietary and lifestyle modification as first line.   - Handouts provided at patient's desire and/or told to go online to the American Diabetes Association website for further information.  9. Class 3 severe obesity with serious comorbidity and body mass index (BMI) of 40.0 to 44.9 in adult, unspecified obesity type (HCC) Denise Grimes is currently in the action stage of change and her goal is to continue with weight loss efforts. I recommend Denise Grimes begin the structured treatment plan as  follows:  She has agreed to the Category 2 Plan with 6 oz protein at lunch and 8-10 oz at dinner, plus 200 calories 10:1 ratio foods.  Exercise goals:  As is    Behavioral modification strategies: increasing lean protein intake, decreasing simple carbohydrates, and meal planning and cooking strategies.  She was informed of the importance of frequent follow-up visits to maximize her success with intensive lifestyle modifications for her multiple health conditions. She was informed we would discuss her lab results at her next visit unless there is a critical issue that needs to be addressed sooner. Denise Grimes agreed to keep her next visit at the agreed upon time to discuss these results.  Objective:   Blood pressure 132/84, pulse 78, temperature 98 F (36.7 C), height 5\' 7"  (1.702 m), weight 260 lb (117.9 kg), last menstrual period 10/18/2010, SpO2 96 %. Body mass index is 40.72 kg/m.  EKG: Normal sinus rhythm, rate 75.  Indirect Calorimeter completed today shows a VO2 of 310 and a REE of 2146.  Her calculated basal metabolic rate is 2147 thus her basal metabolic rate is better than expected.  General: Cooperative, alert, well developed, in no acute distress. HEENT: Conjunctivae and lids unremarkable. Cardiovascular: Regular rhythm.  Lungs: Normal work of breathing. Neurologic: No focal deficits.   Lab Results  Component Value Date   CREATININE 0.88 01/02/2022   BUN 14 01/02/2022   NA 138 01/02/2022   K 4.8 01/02/2022   CL 99 01/02/2022   CO2 21 01/02/2022   Lab Results  Component Value Date   ALT 29 01/02/2022   AST 20 01/02/2022   ALKPHOS 153 (H) 01/02/2022   BILITOT 0.5 01/02/2022   Lab Results  Component Value Date   HGBA1C 6.2 (H) 01/02/2022   HGBA1C 6.1 06/20/2020   HGBA1C 6.1 (H) 01/18/2020   Lab Results  Component Value Date   INSULIN 24.0 01/02/2022   INSULIN 22.1 01/18/2020   Lab Results  Component Value Date   TSH 1.850 01/02/2022   Lab Results  Component  Value Date   CHOL 257 (H) 01/02/2022   HDL 77 01/02/2022   LDLCALC 159 (H) 01/02/2022   TRIG 120 01/02/2022   Lab Results  Component Value Date   WBC 5.9 01/02/2022   HGB 15.4 01/02/2022   HCT 44.5 01/02/2022   MCV 87 01/02/2022   PLT 271 01/02/2022    Attestation Statements:   Reviewed by clinician on day of visit: allergies, medications, problem list, medical history, surgical history, family history, social history, and previous encounter notes.  I, 01/04/2022, BS, CMA, am acting as transcriptionist for Kyung Rudd, DO.   I have reviewed the above documentation for accuracy and completeness, and I agree with the above. [employer], D.O.  The 21st Century Cures Act was signed into law in 2016 which includes the topic of electronic health records.  This provides immediate access to information in MyChart.  This includes consultation notes, operative notes, office  notes, lab results and pathology reports.  If you have any questions about what you read please let us know at your next visit so we can discuss your concerns and take corrective action if need be.  We are right here with you.

## 2022-01-25 NOTE — Progress Notes (Signed)
Chief Complaint:   OBESITY Denise Grimes is here to discuss her progress with her obesity treatment plan along with follow-up of her obesity related diagnoses. Denise Grimes is on the Category 2 Plan and states she is following her eating plan approximately 0% of the time. Denise Grimes states she is not exercising.   Today's visit was #: 2 Starting weight: 260 lbs Starting date: 01/02/2022 Today's weight: 265 lbs Today's date: 01/16/2022 Total lbs lost to date: 0 Total lbs lost since last in-office visit: +5 lbs  Interim History: Denise Grimes is here today for her first follow-up office visit since starting the program with [place].  All blood work/ lab tests that were recently ordered by myself or an outside provider were reviewed with patient today per their request.   Extended time was spent counseling her on all new disease processes that were discovered or preexisting ones that are affected by BMI.  she understands that many of these abnormalities will need to monitored regularly along with the current treatment plan of prudent dietary changes, in which we are making each and every office visit, to improve these health parameters.  Subjective:   1. Pre-diabetes Worsening. Discussed labs with patient today. "I have cookie benders at night after work. "  2. Other hyperlipidemia Discussed labs with patient today. LDL 159, HDL 77, Triglycerides 120, CMP, WNL's.    The 10-year ASCVD risk score (Arnett DK, et al., 2019) is: 3.1%   Values used to calculate the score:     Age: 61 years     Sex: Female     Is Non-Hispanic African American: No     Diabetic: No     Tobacco smoker: No     Systolic Blood Pressure: 829 mmHg     Is BP treated: No     HDL Cholesterol: 77 mg/dL     Total Cholesterol: 257 mg/dL  3. Vitamin D deficiency Discussed labs with patient today. Positive for fatigue and difficulty to lose weight she is not currently taking any supplements.   4. Vitamin B 12 deficiency Discussed labs with  patient today. B12 not at goal.   5. Mood disorder ([place])- Emotional Eating Discussed labs with patient today. Yesterday she met with Melissa at the [place]. Priorities weight loss ,job and later romance.  No need for medications at this time.  Her mood is pretty well controlled.  Thyroid WNL's.   Assessment/Plan:  No orders of the defined types were placed in this encounter.   There are no discontinued medications.   Meds ordered this encounter  Medications   Vitamin D, Ergocalciferol, (DRISDOL) 1.25 MG (50000 UNIT) CAPS capsule    Sig: Take 1 capsule (50,000 Units total) by mouth every 7 (seven) days.    Dispense:  4 capsule    Refill:  0   cyanocobalamin (VITAMIN B12) 500 MCG tablet    Sig: Take 1 tablet (500 mcg total) by mouth daily.     1. Pre-diabetes A1c increased, handouts given and a long discussion was had.   2. Other hyperlipidemia Continue with PNP, decrease saturated and trans fats.   3. Vitamin D deficiency Not at goal.  Start - Vitamin D, Ergocalciferol, (DRISDOL) 1.25 MG (50000 UNIT) CAPS capsule; Take 1 capsule (50,000 Units total) by mouth every 7 (seven) days.  Dispense: 4 capsule; Refill: 0  4. Vitamin B 12 deficiency Start - cyanocobalamin (VITAMIN B12) 500 MCG tablet; Take 1 tablet (500 mcg total) by mouth daily.  5.  Mood disorder ([place])- Emotional Eating Continue with counseling every 1-2 weeks.  Focus on intentional eating.   6. Obesity, Current BMI 41.5 Recipe packet 1 given.  Food and meal prepping discussed.  Patient plans to start in one week.   Denise Grimes is currently in the action stage of change. As such, her goal is to continue with weight loss efforts. She has agreed to the Category 2 Plan.   Exercise goals:  As is.   Behavioral modification strategies: increasing lean protein intake, decreasing simple carbohydrates, no skipping meals, and meal planning and cooking strategies.  Denise Grimes has agreed to follow-up with our clinic in 2 weeks.  She was informed of the importance of frequent follow-up visits to maximize her success with intensive lifestyle modifications for her multiple health conditions.   Objective:   Blood pressure 133/83, pulse 70, temperature 98.2 F (36.8 C), height _0  (1.702 m), weight 265 lb (120.2 kg), last menstrual period 10/18/2010, SpO2 97 %. Body mass index is 41.5 kg/m.  General: Cooperative, alert, well developed, in no acute distress. HEENT: Conjunctivae and lids unremarkable. Cardiovascular: Regular rhythm.  Lungs: Normal work of breathing. Neurologic: No focal deficits.   Lab Results  Component Value Date   CREATININE 0.88 01/02/2022   BUN 14 01/02/2022   NA 138 01/02/2022   K 4.8 01/02/2022   CL 99 01/02/2022   CO2 21 01/02/2022   Lab Results  Component Value Date   ALT 29 01/02/2022   AST 20 01/02/2022   ALKPHOS 153 (H) 01/02/2022   BILITOT 0.5 01/02/2022   Lab Results  Component Value Date   HGBA1C 6.2 (H) 01/02/2022   HGBA1C 6.1 06/20/2020   HGBA1C 6.1 (H) 01/18/2020   Lab Results  Component Value Date   INSULIN 24.0 01/02/2022   INSULIN 22.1 01/18/2020   Lab Results  Component Value Date   TSH 1.850 01/02/2022   Lab Results  Component Value Date   CHOL 257 (H) 01/02/2022   HDL 77 01/02/2022   LDLCALC 159 (H) 01/02/2022   TRIG 120 01/02/2022   Lab Results  Component Value Date   VD25OH 24.1 (L) 01/02/2022   VD25OH 25.8 (L) 01/18/2020   Lab Results  Component Value Date   WBC 5.9 01/02/2022   HGB 15.4 01/02/2022   HCT 44.5 01/02/2022   MCV 87 01/02/2022   PLT 271 01/02/2022   No results found for: "IRON", "TIBC", "FERRITIN"  Attestation Statements:   Reviewed by clinician on day of visit: allergies, medications, problem list, medical history, surgical history, family history, social history, and previous encounter notes.  I, Davy Pique, RMA, am acting as Location manager for [employer], DO.   I have reviewed the above documentation for  accuracy and completeness, and I agree with the above. Marjory Sneddon, D.O.  The [place] was signed into law in 2016 which includes the topic of electronic health records.  This provides immediate access to information in MyChart.  This includes consultation notes, operative notes, office notes, lab results and pathology reports.  If you have any questions about what you read please let us know at your next visit so we can discuss your concerns and take corrective action if need be.  We are right here with you.

## 2022-02-01 ENCOUNTER — Ambulatory Visit (INDEPENDENT_AMBULATORY_CARE_PROVIDER_SITE_OTHER): Payer: 59 | Admitting: Family Medicine

## 2022-02-15 ENCOUNTER — Ambulatory Visit (INDEPENDENT_AMBULATORY_CARE_PROVIDER_SITE_OTHER): Payer: 59 | Admitting: Family Medicine

## 2022-03-01 ENCOUNTER — Ambulatory Visit (INDEPENDENT_AMBULATORY_CARE_PROVIDER_SITE_OTHER): Payer: 59 | Admitting: Family Medicine

## 2022-03-29 ENCOUNTER — Ambulatory Visit (INDEPENDENT_AMBULATORY_CARE_PROVIDER_SITE_OTHER): Payer: 59 | Admitting: Family Medicine

## 2022-04-01 IMAGING — US US ABDOMEN LIMITED
1 series · 14 of 19 positions shown · non-contrast
Comparison: None.

CLINICAL DATA: Left upper quadrant abdominal tenderness. Assess
spleen.

EXAM:
ULTRASOUND ABDOMEN LIMITED

[Series 1: us abdomen limited · 0.23mm/px · 14 of 19 slices shown]
[im 1/19]
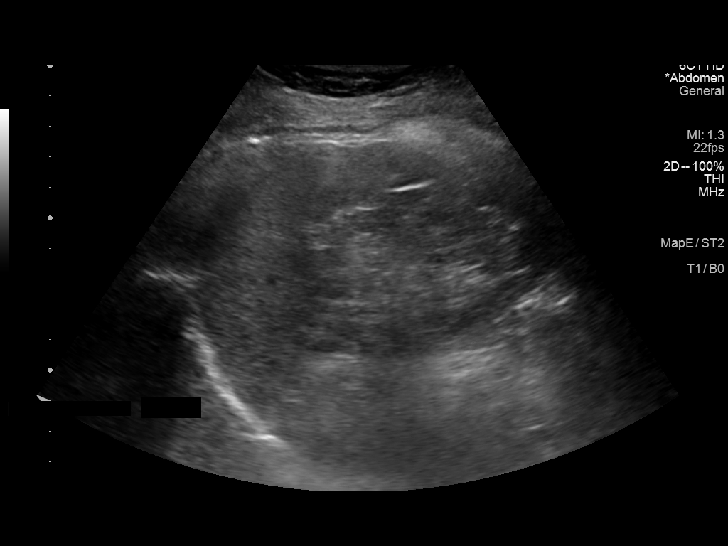
[im 3/19]
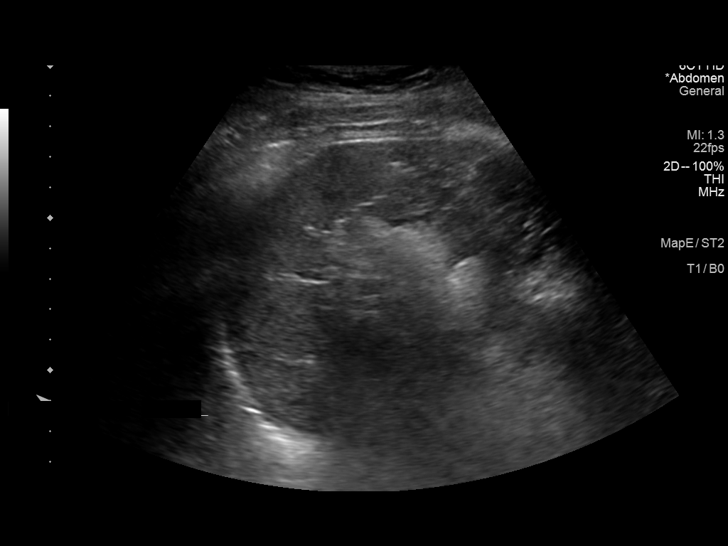
[im 4/19]
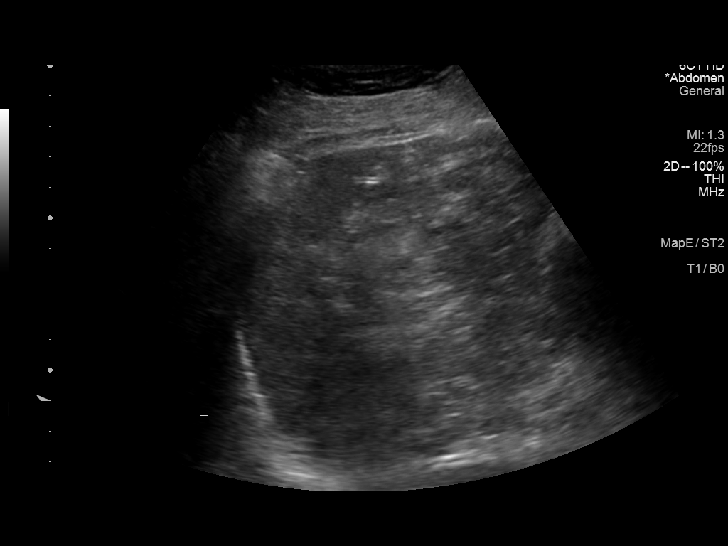
[im 5/19]
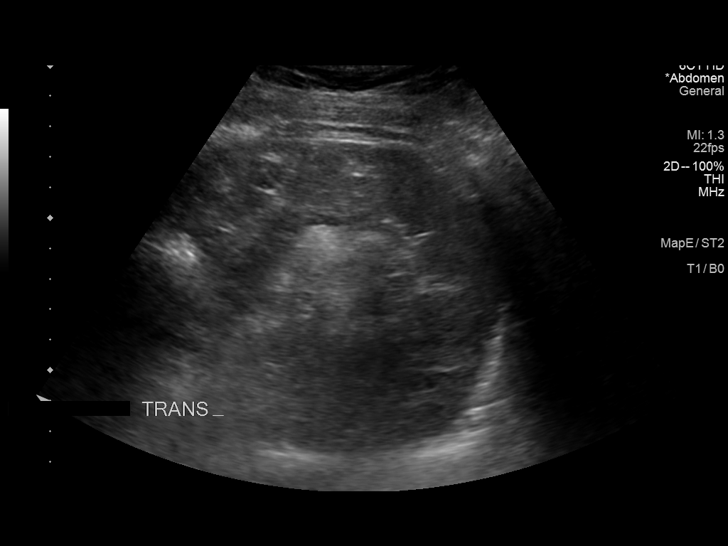
[im 7/19]
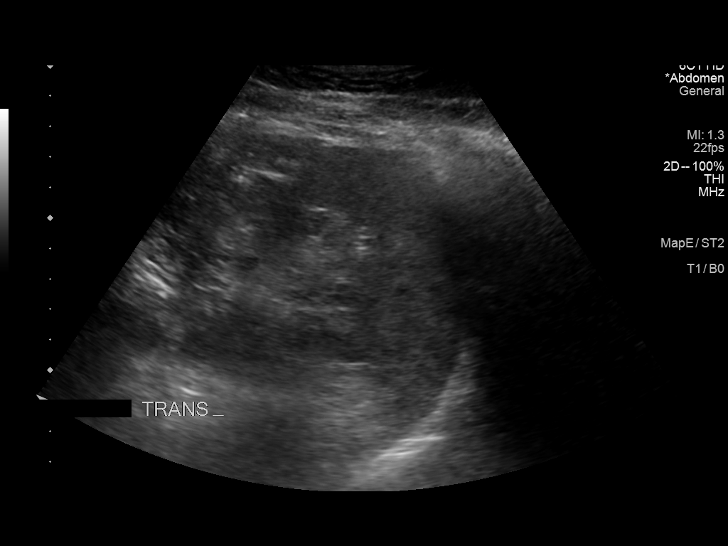
[im 8/19]
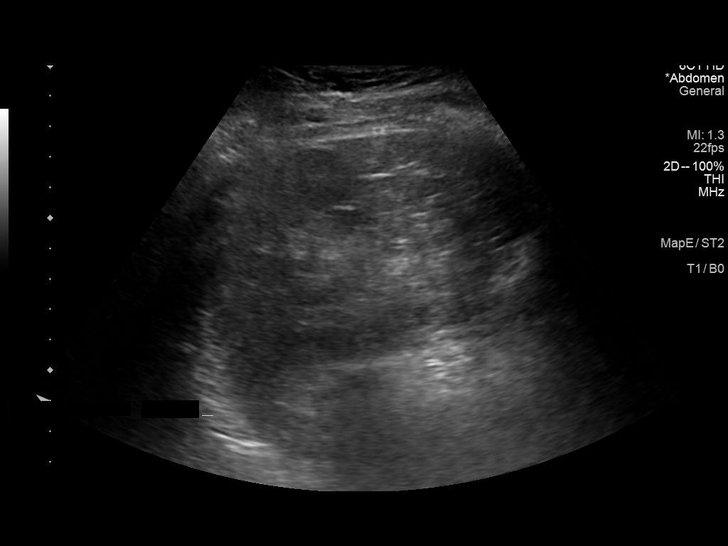
[im 9/19]
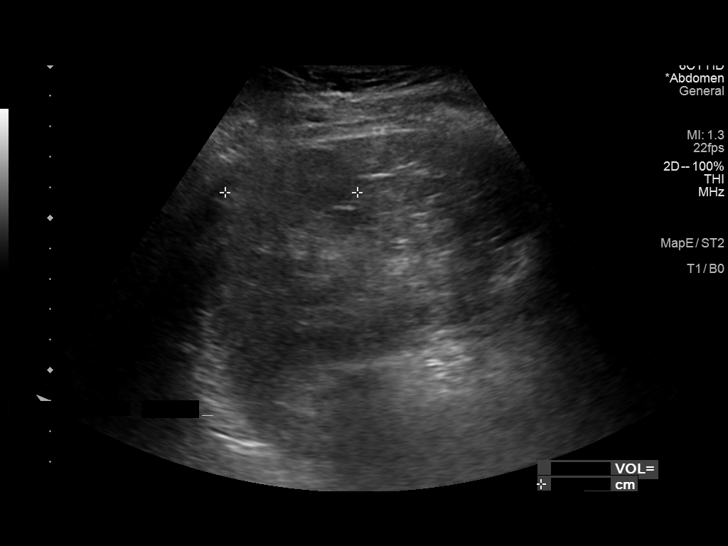
[im 11/19]
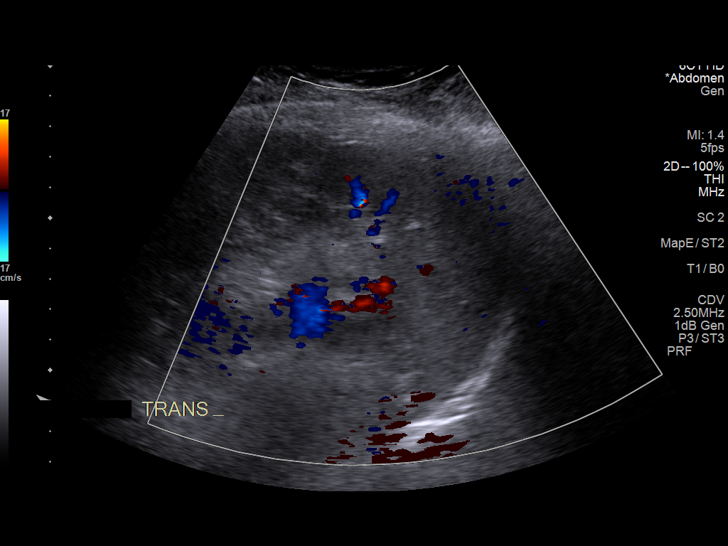
[im 12/19]
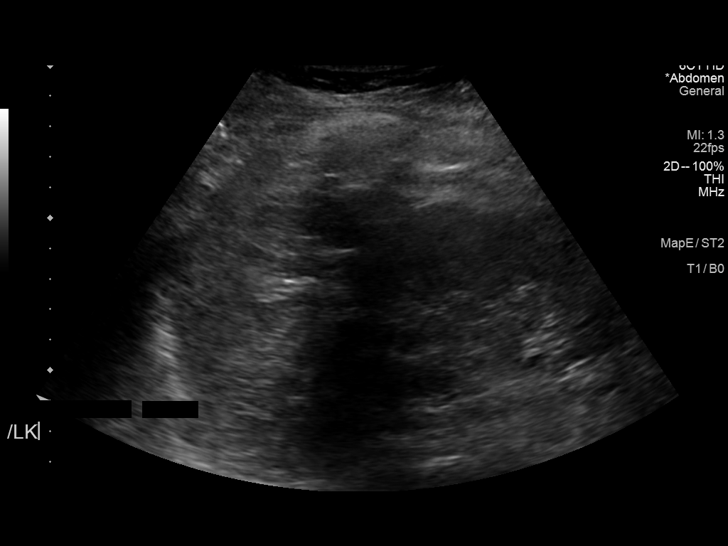
[im 13/19]
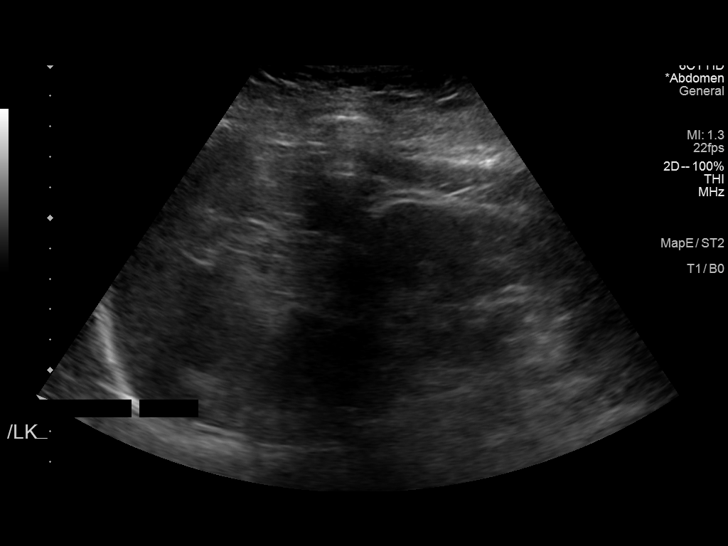
[im 15/19]
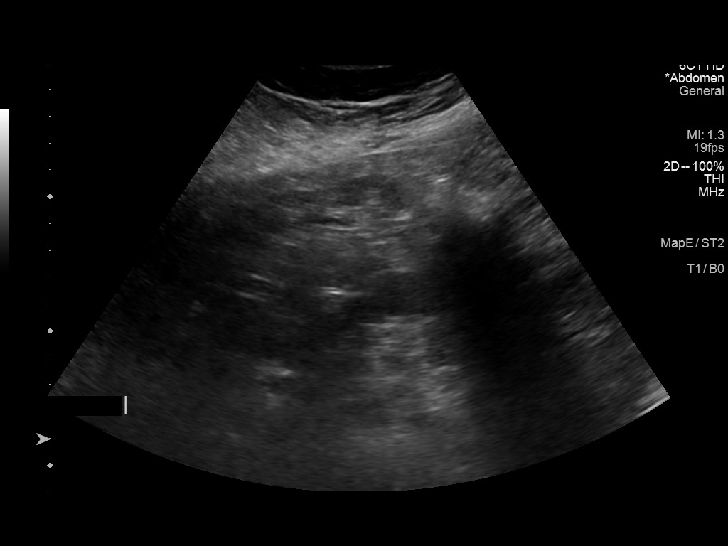
[im 16/19]
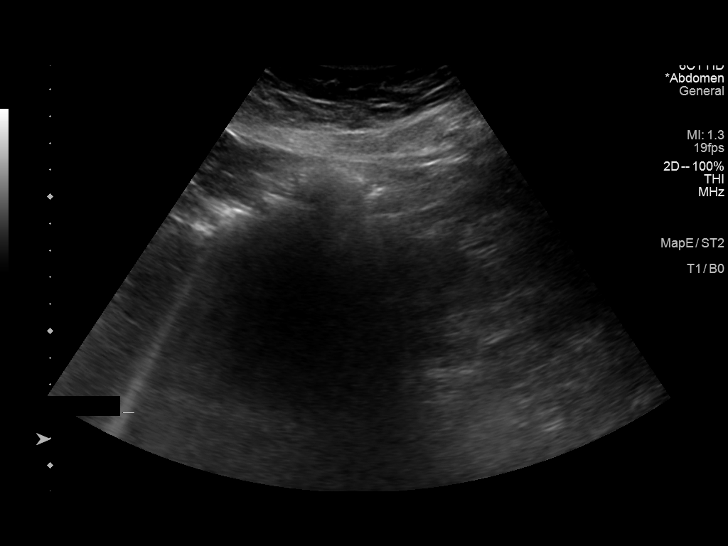
[im 17/19]
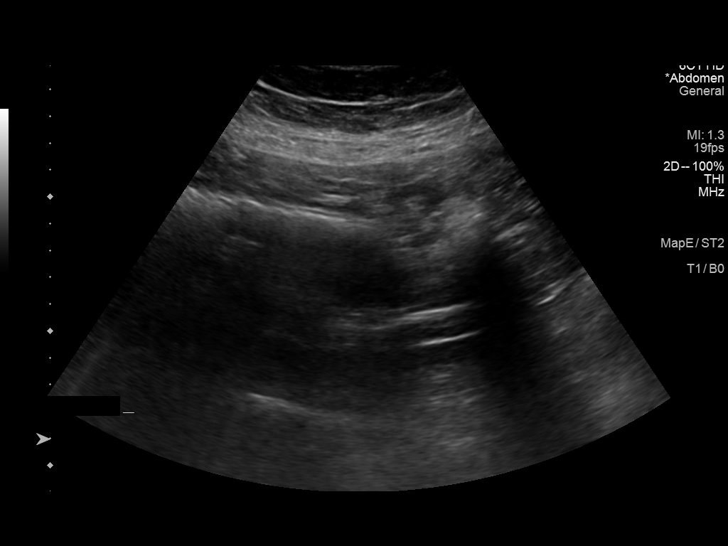
[im 19/19]
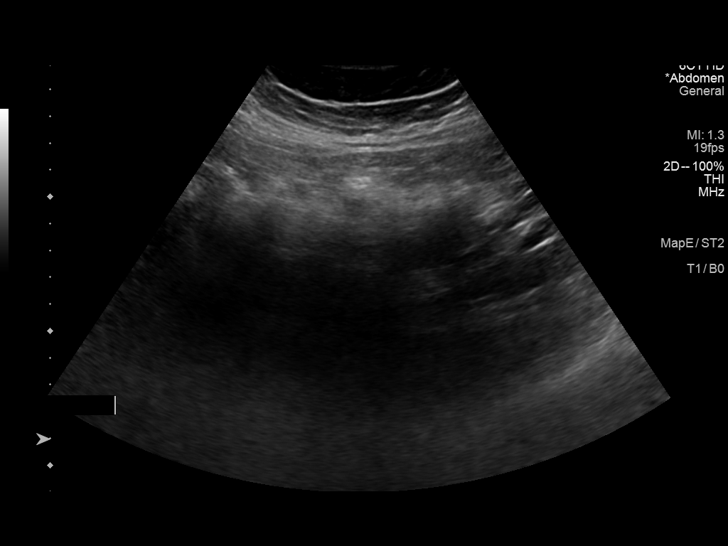

[14 of 19 positions shown; findings below may reference images not displayed]

FINDINGS: Spleen normal in size and configuration measuring 4.3 x 9.9 x
cm, volume 63.8 cubic cm. Bowel gas somewhat limits assessment of
the left upper quadrant. No mass or fluid collection.
IMPRESSION: Negative exam.  Normal size and appearance of the spleen.

## 2022-04-12 ENCOUNTER — Ambulatory Visit (INDEPENDENT_AMBULATORY_CARE_PROVIDER_SITE_OTHER): Payer: 59 | Admitting: Family Medicine

## 2022-04-30 ENCOUNTER — Encounter (INDEPENDENT_AMBULATORY_CARE_PROVIDER_SITE_OTHER): Payer: Self-pay | Admitting: Family Medicine

## 2022-04-30 ENCOUNTER — Ambulatory Visit (INDEPENDENT_AMBULATORY_CARE_PROVIDER_SITE_OTHER): Payer: 59 | Admitting: Family Medicine

## 2022-04-30 VITALS — BP 149/87 | HR 75 | Temp 98.6°F | Ht 67.0 in | Wt 266.4 lb

## 2022-04-30 DIAGNOSIS — Z6841 Body Mass Index (BMI) 40.0 and over, adult: Secondary | ICD-10-CM | POA: Diagnosis not present

## 2022-04-30 DIAGNOSIS — E559 Vitamin D deficiency, unspecified: Secondary | ICD-10-CM | POA: Diagnosis not present

## 2022-04-30 MED ORDER — VITAMIN D (ERGOCALCIFEROL) 1.25 MG (50000 UNIT) PO CAPS: 50000.0000 [IU] | ORAL_CAPSULE | ORAL | 0 refills | Status: AC

## 2022-05-06 ENCOUNTER — Other Ambulatory Visit (INDEPENDENT_AMBULATORY_CARE_PROVIDER_SITE_OTHER): Payer: Self-pay | Admitting: Family Medicine

## 2022-05-06 DIAGNOSIS — E538 Deficiency of other specified B group vitamins: Secondary | ICD-10-CM

## 2022-05-10 NOTE — Progress Notes (Signed)
Chief Complaint:   OBESITY Denise Grimes is here to discuss her progress with her obesity treatment plan along with follow-up of her obesity related diagnoses. Denise Grimes is on the Category 2 Plan and states she is following her eating plan approximately 0% of the time. Denise Grimes states she is doing spin class 45 minutes 3 times per week.  Today's visit was #: 3 Starting weight: 260 LBS Starting date: 01/02/2022 Today's weight: 266 LBS Today's date: 04/30/2022 Total lbs lost to date: 0 Total lbs lost since last in-office visit: +1 LBS  Interim History: Patients cat got shot, family members passed.  Patient has increased stress since last office visit on 01/16/2022.  Patient just started with counseling and with Ms.Mekita.  She has been going to spin classes.  Subjective:   1. Vitamin D deficiency Patient has not started meds yet.  She has never picked them up and needs another prescription.    Assessment/Plan:  No orders of the defined types were placed in this encounter.   Medications Discontinued During This Encounter  Medication Reason   Vitamin D, Ergocalciferol, (DRISDOL) 1.25 MG (50000 UNIT) CAPS capsule Reorder     Meds ordered this encounter  Medications   Vitamin D, Ergocalciferol, (DRISDOL) 1.25 MG (50000 UNIT) CAPS capsule    Sig: Take 1 capsule (50,000 Units total) by mouth every 7 (seven) days.    Dispense:  4 capsule    Refill:  0     1. Vitamin D deficiency Start ergocalciferol weekly.  Patient lost previous prescription.  Counseling done.  Refill- Vitamin D, Ergocalciferol, (DRISDOL) 1.25 MG (50000 UNIT) CAPS capsule; Take 1 capsule (50,000 Units total) by mouth every 7 (seven) days.  Dispense: 4 capsule; Refill: 0  2. BMI 40.0-44.9, adult (HCC)-current bmi 41.7  3. Morbid obesity (HCC)-start bmi 40.72 Start meal plan and start vitamin D/ergocalciferol and B12.  Denise Grimes is currently in the action stage of change. As such, her goal is to continue with weight loss  efforts. She has agreed to the Category 2 Plan.   Exercise goals:  As is.  Behavioral modification strategies: meal planning and cooking strategies and keeping healthy foods in the home.  Denise Grimes has agreed to follow-up with our clinic in 2 weeks. She was informed of the importance of frequent follow-up visits to maximize her success with intensive lifestyle modifications for her multiple health conditions.   Objective:   Blood pressure (!) 149/87, pulse 75, temperature 98.6 F (37 C), height '5\' 7"'$  (1.702 m), last menstrual period 10/18/2010, SpO2 97 %. Body mass index is 41.5 kg/m.  General: Cooperative, alert, well developed, in no acute distress. HEENT: Conjunctivae and lids unremarkable. Cardiovascular: Regular rhythm.  Lungs: Normal work of breathing. Neurologic: No focal deficits.   Lab Results  Component Value Date   CREATININE 0.88 01/02/2022   BUN 14 01/02/2022   NA 138 01/02/2022   K 4.8 01/02/2022   CL 99 01/02/2022   CO2 21 01/02/2022   Lab Results  Component Value Date   ALT 29 01/02/2022   AST 20 01/02/2022   ALKPHOS 153 (H) 01/02/2022   BILITOT 0.5 01/02/2022   Lab Results  Component Value Date   HGBA1C 6.2 (H) 01/02/2022   HGBA1C 6.1 06/20/2020   HGBA1C 6.1 (H) 01/18/2020   Lab Results  Component Value Date   INSULIN 24.0 01/02/2022   INSULIN 22.1 01/18/2020   Lab Results  Component Value Date   TSH 1.850 01/02/2022   Lab Results  Component Value Date   CHOL 257 (H) 01/02/2022   HDL 77 01/02/2022   LDLCALC 159 (H) 01/02/2022   TRIG 120 01/02/2022   Lab Results  Component Value Date   VD25OH 24.1 (L) 01/02/2022   VD25OH 25.8 (L) 01/18/2020   Lab Results  Component Value Date   WBC 5.9 01/02/2022   HGB 15.4 01/02/2022   HCT 44.5 01/02/2022   MCV 87 01/02/2022   PLT 271 01/02/2022   No results found for: "IRON", "TIBC", "FERRITIN"  Attestation Statements:   Reviewed by clinician on day of visit: allergies, medications, problem  list, medical history, surgical history, family history, social history, and previous encounter notes.  I, Davy Pique, RMA, am acting as Location manager for [employer], DO.   I have reviewed the above documentation for accuracy and completeness, and I agree with the above. Marjory Sneddon, D.O.  The [place] was signed into law in 2016 which includes the topic of electronic health records.  This provides immediate access to information in MyChart.  This includes consultation notes, operative notes, office notes, lab results and pathology reports.  If you have any questions about what you read please let us know at your next visit so we can discuss your concerns and take corrective action if need be.  We are right here with you.

## 2022-05-16 ENCOUNTER — Ambulatory Visit (INDEPENDENT_AMBULATORY_CARE_PROVIDER_SITE_OTHER): Payer: 59 | Admitting: Family Medicine

## 2022-05-31 ENCOUNTER — Ambulatory Visit (INDEPENDENT_AMBULATORY_CARE_PROVIDER_SITE_OTHER): Payer: 59 | Admitting: Family Medicine

## 2022-06-12 ENCOUNTER — Ambulatory Visit (INDEPENDENT_AMBULATORY_CARE_PROVIDER_SITE_OTHER): Payer: 59 | Admitting: Family Medicine
